# Patient Record
Sex: Female | Born: 1975 | Race: White | Hispanic: No | Marital: Married | Smoking: Never smoker
Health system: Southern US, Community
[De-identification: ages and names within clinical notes are randomized; demographics above are authoritative.]

## PROBLEM LIST (undated history)

## (undated) DIAGNOSIS — R51 Headache: Secondary | ICD-10-CM

## (undated) DIAGNOSIS — F329 Major depressive disorder, single episode, unspecified: Secondary | ICD-10-CM

## (undated) DIAGNOSIS — F32A Depression, unspecified: Secondary | ICD-10-CM

## (undated) HISTORY — PX: DILATION AND CURETTAGE OF UTERUS: SHX78

---

## 1997-08-28 HISTORY — PX: WISDOM TOOTH EXTRACTION: SHX21

## 2003-11-26 ENCOUNTER — Other Ambulatory Visit: Admission: RE | Admit: 2003-11-26 | Discharge: 2003-11-26 | Payer: Self-pay | Admitting: Family Medicine

## 2005-04-17 ENCOUNTER — Other Ambulatory Visit: Admission: RE | Admit: 2005-04-17 | Discharge: 2005-04-17 | Payer: Self-pay | Admitting: Family Medicine

## 2006-06-01 ENCOUNTER — Other Ambulatory Visit: Admission: RE | Admit: 2006-06-01 | Discharge: 2006-06-01 | Payer: Self-pay | Admitting: Family Medicine

## 2007-02-27 ENCOUNTER — Encounter: Admission: RE | Admit: 2007-02-27 | Discharge: 2007-04-03 | Payer: Self-pay | Admitting: Family Medicine

## 2007-06-13 ENCOUNTER — Other Ambulatory Visit: Admission: RE | Admit: 2007-06-13 | Discharge: 2007-06-13 | Payer: Self-pay | Admitting: Family Medicine

## 2010-09-12 ENCOUNTER — Inpatient Hospital Stay (HOSPITAL_COMMUNITY)
Admission: AD | Admit: 2010-09-12 | Discharge: 2010-09-16 | Payer: Self-pay | Source: Home / Self Care | Attending: Obstetrics and Gynecology | Admitting: Obstetrics and Gynecology

## 2010-09-13 ENCOUNTER — Encounter (INDEPENDENT_AMBULATORY_CARE_PROVIDER_SITE_OTHER): Payer: Self-pay | Admitting: Obstetrics and Gynecology

## 2010-09-14 LAB — CBC
HCT: 40 % (ref 36.0–46.0)
Hemoglobin: 14.2 g/dL (ref 12.0–15.0)
MCH: 30.9 pg (ref 26.0–34.0)
MCHC: 35.5 g/dL (ref 30.0–36.0)
MCV: 87 fL (ref 78.0–100.0)
Platelets: 249 10*3/uL (ref 150–400)
RBC: 4.6 MIL/uL (ref 3.87–5.11)
RDW: 14 % (ref 11.5–15.5)
WBC: 11.5 10*3/uL — ABNORMAL HIGH (ref 4.0–10.5)

## 2010-09-14 LAB — RPR: RPR Ser Ql: NONREACTIVE

## 2010-09-18 NOTE — Discharge Summary (Signed)
NAMEALLICE, GARRO            ACCOUNT NO.:  [id number]  MEDICAL RECORD NO.:  [id number]          PATIENT TYPE:  INP  LOCATION:  9118                          FACILITY:  [facility]  PHYSICIAN:  Huel Cote, M.D. DATE OF BIRTH:  03/31/1976  DATE OF ADMISSION:  09/12/2010 DATE OF DISCHARGE:  09/16/2010                              DISCHARGE SUMMARY   DISCHARGE DIAGNOSES: 1. Term pregnancy at 39-6/7 weeks' delivered. 2. Arrest of descent. 3. Chorioamnionitis. 4. Failed vacuum and a primary low transverse cesarean section.  DISCHARGE MEDICATIONS: 1. Motrin 600 mg p.o. every 6 h. 2. Percocet 1-2 tablets p.o. every 4 h. p.r.n.  DISCHARGE FOLLOWUP:  The patient is to follow up in the office in 2 weeks for an incision check.  HOSPITAL COURSE:  The patient is a 35 year old, G1, P0, who was admitted at 39-6/7 weeks after she presented with spontaneous rupture of membranes and no contractions.  She had had an uneventful prenatal course except for some mild depression which was stable on Zoloft.  PRENATAL LABORATORY DATA:  A positive, antibody negative, rubella immune, hepatitis B surface antigen negative, RPR negative, HIV negative, GC negative, Chlamydia negative, cystic fibrosis negative, 1- hour Glucola 102, group B strep negative, first trimester screen normal, alpha-fetoprotein negative.  PAST OBSTETRICAL HISTORY:  None.  PAST GYNECOLOGIC HISTORY:  None.  PAST SURGICAL HISTORY:  None.  PAST MEDICAL HISTORY:  Migraines and depression as stated.  ALLERGIES:  None.  MEDICATIONS:  Zoloft 50 mg p.o. daily.  SOCIAL HISTORY:  She is married and a Runner, broadcasting/film/video.  She has no tobacco, alcohol or drugs.  On admission, she was afebrile with stable vital signs.  Fetal heart rate was reactive.  Cervix was 51 at a -2 station with clear fluid noted on admission grossly ruptured.  She was having no significant contractions on admission, therefore, she was placed on low-dose Pitocin.  She  latent phase labor most of the night and early in the morning of the 17th, was found to be 6-cm dilated and then continued to progress to complete dilation.  She then pushed for approximately 3 hours and brought the vertex LOA to a +2 station.  At that point, options were discussed with the patient that she was making no further progress and decision was made to proceed with a attempted vacuum- assisted delivery.  This was done and no progress was made over 5 pulls. At that point, the patient's temperature was 100.4 and she was started on Unasyn for chorioamnionitis.  She then was counseled and underwent a primary low transverse C-section by Dr. Jackelyn Knife.  She had a viable female infant, Apgars were 9 and 9, weight was 8 pounds 13 ounces. Estimated blood loss was 1000 mL.  She was admitted for routine postoperative care.  On postop day #1, she was doing well.  Her hemoglobin dropped to 9.8 from 14.2.  She continued her Unasyn for 24 hours and this was discontinued when she was afebrile for that amount of time.  By postop day #3, she was ambulating well.  Her pain was well controlled on p.o. medication.  She was tolerating regular diet.  She  was afebrile with stable vital signs.  Her fundus was firm.  Her incision appeared well approximated.  Staples were removed and Steri- Strips were placed.  She was given instructions to follow up in the office in 2 weeks for an incision check.  Also, instructed on pelvic rest and we will call the office for that appointment.     Huel Cote, M.D.     KR/MEDQ  D:  09/16/2010  T:  09/16/2010  Job:  [id number]  Electronically Signed by Huel Cote M.D. on 09/18/2010 10:22:00 AM

## 2010-09-19 LAB — CBC
HCT: 28.3 % — ABNORMAL LOW (ref 36.0–46.0)
Hemoglobin: 9.8 g/dL — ABNORMAL LOW (ref 12.0–15.0)
MCH: 30.4 pg (ref 26.0–34.0)
MCHC: 34.6 g/dL (ref 30.0–36.0)
MCV: 87.9 fL (ref 78.0–100.0)
Platelets: 196 10*3/uL (ref 150–400)
RBC: 3.22 MIL/uL — ABNORMAL LOW (ref 3.87–5.11)
RDW: 13.9 % (ref 11.5–15.5)
WBC: 19.6 10*3/uL — ABNORMAL HIGH (ref 4.0–10.5)

## 2010-09-20 NOTE — Op Note (Signed)
Tonya Krause, Tonya Krause            ACCOUNT NO.:  [id number]  MEDICAL RECORD NO.:  [id number]          PATIENT TYPE:  INP  LOCATION:  9168                          [facility]ILITY:  [facility]  PHYSICIAN:  Leighton Roach Meisinger, M.D.DATE OF BIRTH:  01-08-1976  DATE OF PROCEDURE:  09/13/2010 DATE OF DISCHARGE:                              OPERATIVE REPORT   PREOPERATIVE DIAGNOSES:  Intrauterine pregnancy at 40 weeks, arrest of descent, and chorioamnionitis.  POSTOPERATIVE DIAGNOSES:  Intrauterine pregnancy at 40 weeks, arrest of descent, and chorioamnionitis.  PROCEDURE:  She had a primary low-transverse cesarean section.  SURGEON:  Zenaida Niece, M.D.  ASSISTANT:  Malachi Pro. Ambrose Mantle, M.D.  ANESTHESIA:  Epidural.  FINDINGS:  The patient had normal gravid anatomy and delivered a viable female infant with Apgars of 9 and 9, weight 8 pounds 13 ounces.  SPECIMENS:  Placenta sent for routine Pathology.  ESTIMATED BLOOD LOSS:  1000 mL.  COMPLICATIONS:  None.  PROCEDURE IN DETAIL:  The patient was taken to the operating room and placed in the dorsal supine position with left lateral tilt.  Abdomen was prepped and draped in the usual sterile fashion and a Foley catheter had previously been placed.  The level of her anesthesia was found to be adequate.  Abdomen was then entered via a standard Pfannenstiel incision without difficulty.  The Alexis disposable self-retaining retractor was then placed and good visualization was achieved.  A 4-cm transverse incision was then made in the lower uterine segment pushing the bladder inferior.  Once the uterine cavity was entered, the incision was extended digitally.  Fetal vertex was deep in the pelvis.  It was grasped and gradually brought to the incision.  The vertex then delivered atraumatically.  A loose nuchal cord x1 was reduced and mouth and nares were suctioned.  The remainder of the infant then delivered atraumatically.  The cord was doubly clamped  and cut and the infant handed to the awaiting pediatric team.  Cord blood was obtained and the placenta was manually removed.  Uterus was wiped dry with a clean lap pad and all clots and debris removed.  The incision had extended laterally on the right side into the uterine artery.  The incision was closed in two layers.  A suture from the left angle running locking #1 chromic to just to the right of the midline.  A similar suture starting at the right angle running locking with #1 chromic with adequate hemostasis.  An imbricating layer then with #1 chromic was also placed with adequate closure and adequate hemostasis.  Small amount of bleeding from the right side was controlled with figure-of-eight sutures of 3-0 Vicryl and electrocautery.  Tubes and ovaries were inspected and found to be normal.  The Alexis retractor was removed.  Subfascial space was irrigated and made hemostatic with electrocautery.  Fascia was closed in running fashion starting at both ends and meeting in the middle with 0 Vicryl.  Subcutaneous tissue was then irrigated and made hemostatic with electrocautery.  Skin was closed with staples followed by a sterile dressing.  Attention was turned vaginally.  The drapes were removed and the sterile dressing  was placed.  The patient had a vaginal tear from her vacuum delivery.  A sponge had been placed in the vagina and this was removed. The patient had a vaginal band of tissue that had torn when the vacuum was placed.  There was still some bleeding from the right side of vagina.  This was controlled with one figure-of-eight suture of 3-0 Vicryl.  No significant bleeding was noted.  The patient tolerated the procedure well and was taken to the recovery room in stable condition. Counts were correct and she had PAS hose on throughout the procedure.     Zenaida Niece, M.D.     TDM/MEDQ  D:  09/13/2010  T:  09/14/2010  Job:  [id number]  Electronically Signed by Lavina Hamman M.D. on 09/20/2010 09:01:58 AM

## 2013-04-03 ENCOUNTER — Other Ambulatory Visit: Payer: Self-pay | Admitting: Obstetrics and Gynecology

## 2013-04-03 NOTE — H&P (Signed)
[name], Tonya Krause            ACCOUNT NO.:  [id number]  MEDICAL RECORD NO.:  [id number]  LOCATION:  [facility]                         [facility]ACILITY:  [facility]  PHYSICIAN:  Malachi Pro. Ambrose Mantle, M.D. DATE OF BIRTH:  [birth year]  DATE OF ADMISSION:  04/04/2013 DATE OF DISCHARGE:                             HISTORY & PHYSICAL   PRESENT ILLNESS:  A 37 year old white female, para 1-0-0-1, gravida 2, who is admitted for suction D and C because of nonviable twin pregnancy. This patient had a routine 9 week ultrasound that showed twin pregnancies, one had an empty sac, the other had a nonviable 8 week and 3 day embryo.  This was repeated 3 days later and still showed the same findings.  PAST MEDICAL HISTORY:  Allergies to SULFA and IMITREX.  PAST SURGICAL HISTORY:  She has not had any surgeries except for C- section.  MEDICAL HISTORY:  Migraines.  FAMILY HISTORY:  Mother with diabetes.  Maternal grandfather with diabetes, heart disease, and mother also has hypothyroidism.  SOCIAL HISTORY:  The patient does not smoke, drink, or take drugs.  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  PHYSICAL EXAMINATION:  GENERAL:  On admission, well-developed, well- nourished white female, in no distress. HEART:  Normal size and sounds.  No murmurs. LUNGS:  Clear to auscultation. ABDOMEN:  Soft. PELVIC:  Has not been done, but the ultrasound has been done twice in the last week confirming an empty sac and a nonviable embryo.  ADMITTING IMPRESSION:  Intrauterine pregnancy at about 9 weeks with nonviable twin pregnancy.  The patient was advised to wait until Dr. Jackelyn Knife returns to have him decide on the course of action with her input, but she chose to proceed with evacuation of the uterus.  She has been counseled about the risks of surgery and is ready to proceed.  Her blood group and type is A positive.     Malachi Pro. Ambrose Mantle, M.D.    TFH/MEDQ  D:  04/03/2013  T:  04/03/2013  Job:  [id number]

## 2013-04-04 ENCOUNTER — Encounter (HOSPITAL_COMMUNITY): Payer: Self-pay | Admitting: Anesthesiology

## 2013-04-04 ENCOUNTER — Encounter (HOSPITAL_COMMUNITY)
Admission: RE | Disposition: A | Discharge status: Home / Self Care | Payer: Self-pay | Source: Ambulatory Visit | Attending: Obstetrics and Gynecology

## 2013-04-04 ENCOUNTER — Encounter (HOSPITAL_COMMUNITY): Payer: Self-pay | Admitting: *Deleted

## 2013-04-04 ENCOUNTER — Ambulatory Visit (HOSPITAL_COMMUNITY): Payer: BC Managed Care – PPO | Admitting: Anesthesiology

## 2013-04-04 ENCOUNTER — Ambulatory Visit (HOSPITAL_COMMUNITY)
Admission: RE | Admit: 2013-04-04 | Discharge: 2013-04-04 | Disposition: A | Discharge status: Home / Self Care | Payer: BC Managed Care – PPO | Source: Ambulatory Visit | Attending: Obstetrics and Gynecology | Admitting: Obstetrics and Gynecology

## 2013-04-04 DIAGNOSIS — O021 Missed abortion: Secondary | ICD-10-CM | POA: Insufficient documentation

## 2013-04-04 DIAGNOSIS — O0289 Other abnormal products of conception: Secondary | ICD-10-CM

## 2013-04-04 DIAGNOSIS — O30009 Twin pregnancy, unspecified number of placenta and unspecified number of amniotic sacs, unspecified trimester: Secondary | ICD-10-CM | POA: Insufficient documentation

## 2013-04-04 HISTORY — DX: Headache: R51

## 2013-04-04 HISTORY — DX: Depression, unspecified: F32.A

## 2013-04-04 HISTORY — DX: Major depressive disorder, single episode, unspecified: F32.9

## 2013-04-04 HISTORY — PX: DILATION AND EVACUATION: SHX1459

## 2013-04-04 LAB — URINALYSIS, ROUTINE W REFLEX MICROSCOPIC
Hgb urine dipstick: NEGATIVE
Protein, ur: NEGATIVE mg/dL
Specific Gravity, Urine: 1.02 (ref 1.005–1.030)
Urobilinogen, UA: 0.2 mg/dL (ref 0.0–1.0)

## 2013-04-04 LAB — COMPREHENSIVE METABOLIC PANEL
AST: 14 U/L (ref 0–37)
Albumin: 3.5 g/dL (ref 3.5–5.2)
BUN: 7 mg/dL (ref 6–23)
CO2: 21 mEq/L (ref 19–32)
Calcium: 9.3 mg/dL (ref 8.4–10.5)
Creatinine, Ser: 0.64 mg/dL (ref 0.50–1.10)
GFR calc non Af Amer: >90 mL/min (ref >90)

## 2013-04-04 LAB — CBC
HCT: 37.8 % (ref 36.0–46.0)
Hemoglobin: 13.2 g/dL (ref 12.0–15.0)
MCH: 28.3 pg (ref 26.0–34.0)
MCHC: 34.9 g/dL (ref 30.0–36.0)
MCV: 80.9 fL (ref 78.0–100.0)
Platelets: 256 10*3/uL (ref 150–400)
RBC: 4.67 MIL/uL (ref 3.87–5.11)
RDW: 13.5 % (ref 11.5–15.5)
WBC: 7.9 10*3/uL (ref 4.0–10.5)

## 2013-04-04 LAB — URINE MICROSCOPIC-ADD ON

## 2013-04-04 SURGERY — DILATION AND EVACUATION, UTERUS
Anesthesia: Monitor Anesthesia Care | Wound class: Clean Contaminated

## 2013-04-04 MED ORDER — FENTANYL CITRATE 0.05 MG/ML IJ SOLN
INTRAMUSCULAR | Status: AC
  Filled 2013-04-04: qty 5

## 2013-04-04 MED ORDER — LIDOCAINE HCL 1 % IJ SOLN
INTRAMUSCULAR | Status: DC | PRN
  Administered 2013-04-04: 10 mL

## 2013-04-04 MED ORDER — FENTANYL CITRATE 0.05 MG/ML IJ SOLN: 25.0000 ug | INTRAMUSCULAR | Status: DC | PRN

## 2013-04-04 MED ORDER — ONDANSETRON HCL 4 MG/2ML IJ SOLN
INTRAMUSCULAR | Status: AC
  Filled 2013-04-04: qty 2

## 2013-04-04 MED ORDER — MIDAZOLAM HCL 5 MG/5ML IJ SOLN
INTRAMUSCULAR | Status: DC | PRN
  Administered 2013-04-04: 2 mg via INTRAVENOUS

## 2013-04-04 MED ORDER — MIDAZOLAM HCL 2 MG/2ML IJ SOLN
INTRAMUSCULAR | Status: AC
  Filled 2013-04-04: qty 2

## 2013-04-04 MED ORDER — PROPOFOL 10 MG/ML IV EMUL
INTRAVENOUS | Status: AC
  Filled 2013-04-04: qty 20

## 2013-04-04 MED ORDER — LACTATED RINGERS IV SOLN
INTRAVENOUS | Status: DC
  Administered 2013-04-04 (×2): via INTRAVENOUS

## 2013-04-04 MED ORDER — FENTANYL CITRATE 0.05 MG/ML IJ SOLN
INTRAMUSCULAR | Status: DC | PRN
  Administered 2013-04-04 (×3): 50 ug via INTRAVENOUS

## 2013-04-04 MED ORDER — ONDANSETRON HCL 4 MG/2ML IJ SOLN
INTRAMUSCULAR | Status: DC | PRN
  Administered 2013-04-04: 4 mg via INTRAVENOUS

## 2013-04-04 MED ORDER — LIDOCAINE HCL (CARDIAC) 20 MG/ML IV SOLN
INTRAVENOUS | Status: DC | PRN
  Administered 2013-04-04: 50 mg via INTRAVENOUS

## 2013-04-04 MED ORDER — PROPOFOL INFUSION 10 MG/ML OPTIME
INTRAVENOUS | Status: DC | PRN
  Administered 2013-04-04: 160 ug/kg/min via INTRAVENOUS

## 2013-04-04 MED ORDER — IBUPROFEN 600 MG PO TABS: 600.0000 mg | ORAL_TABLET | Freq: Four times a day (QID) | ORAL | Status: DC | PRN

## 2013-04-04 MED ORDER — OXYTOCIN 10 UNIT/ML IJ SOLN: INTRAMUSCULAR | Status: DC | PRN

## 2013-04-04 MED ORDER — KETOROLAC TROMETHAMINE 30 MG/ML IJ SOLN
INTRAMUSCULAR | Status: DC | PRN
  Administered 2013-04-04: 30 mg via INTRAVENOUS

## 2013-04-04 MED ORDER — OXYTOCIN 10 UNIT/ML IJ SOLN
INTRAMUSCULAR | Status: AC
  Filled 2013-04-04: qty 2

## 2013-04-04 MED ORDER — OXYTOCIN 10 UNIT/ML IJ SOLN
INTRAMUSCULAR | Status: DC | PRN
  Administered 2013-04-04: 20 [IU] via INTRAMUSCULAR

## 2013-04-04 MED ORDER — KETOROLAC TROMETHAMINE 30 MG/ML IJ SOLN: 15.0000 mg | Freq: Once | INTRAMUSCULAR | Status: DC | PRN

## 2013-04-04 SURGICAL SUPPLY — 20 items
CATH ROBINSON RED A/P 16FR (CATHETERS) ×2 IMPLANT
CLOTH BEACON ORANGE TIMEOUT ST (SAFETY) ×2 IMPLANT
DECANTER SPIKE VIAL GLASS SM (MISCELLANEOUS) ×2 IMPLANT
GLOVE BIO SURGEON STRL SZ7.5 (GLOVE) ×2 IMPLANT
GOWN PREVENTION PLUS XLARGE (GOWN DISPOSABLE) ×2 IMPLANT
GOWN STRL REIN XL XLG (GOWN DISPOSABLE) ×4 IMPLANT
KIT BERKELEY 1ST TRIMESTER 3/8 (MISCELLANEOUS) ×2 IMPLANT
NDL SPNL 22GX3.5 QUINCKE BK (NEEDLE) ×1 IMPLANT
NEEDLE SPNL 22GX3.5 QUINCKE BK (NEEDLE) ×2 IMPLANT
NS IRRIG 1000ML POUR BTL (IV SOLUTION) ×2 IMPLANT
PACK VAGINAL MINOR WOMEN LF (CUSTOM PROCEDURE TRAY) ×2 IMPLANT
PAD OB MATERNITY 4.3X12.25 (PERSONAL CARE ITEMS) ×2 IMPLANT
PAD PREP 24X48 CUFFED NSTRL (MISCELLANEOUS) ×2 IMPLANT
SET BERKELEY SUCTION TUBING (SUCTIONS) ×2 IMPLANT
SYR CONTROL 10ML LL (SYRINGE) ×2 IMPLANT
TOWEL OR 17X24 6PK STRL BLUE (TOWEL DISPOSABLE) ×4 IMPLANT
VACURETTE 10 RIGID CVD (CANNULA) IMPLANT
VACURETTE 7MM CVD STRL WRAP (CANNULA) IMPLANT
VACURETTE 8 RIGID CVD (CANNULA) IMPLANT
VACURETTE 9 RIGID CVD (CANNULA) IMPLANT

## 2013-04-04 NOTE — Op Note (Signed)
Tonya Krause, GASCA            ACCOUNT NO.:  [id number]  MEDICAL RECORD NO.:  [id number]  LOCATION:  [facility]                          [facility]  [facility]  PHYSICIAN:  Malachi Pro. Ambrose Mantle, M.D. DATE OF BIRTH:  1976/08/01  DATE OF PROCEDURE:  04/04/2013 DATE OF DISCHARGE:                              OPERATIVE REPORT   PREOPERATIVE DIAGNOSIS:  Nonviable twin pregnancy at 8-1/2 weeks.  POSTOPERATIVE DIAGNOSIS:  Nonviable twin pregnancy at 8-1/2 weeks.  OPERATION:  Suction D and C.  OPERATOR:  Malachi Pro. Ambrose Mantle, M.D.  ANESTHESIA:  MAC anesthesia.  OPERATIVE REPORT:  The patient was brought to the operating room and given general anesthesia by MAC.  She was placed in lithotomy position. The vulva, vagina, perineum, and urethra were prepped with Betadine solution.  The bladder was emptied with [place] catheter.  The uterus was posterior approximately 8-9 week size.  The adnexa were free of masses. A weighted speculum was placed posteriorly.  The anterior lip of the cervix was grasped with a tenaculum and the uterus sounded to 11 cm posteriorly.  The uterus was then dilated to a #31 Pratt dilator.  A #9 curved suction curette easily entered the endometrial cavity.  A suction D and C was done, producing significant amount of tissue.  I then used a sharp curette to curette the walls to see if I thought all the tissue was removed and then did a final circuit with the suction.  I then inspected the tissue after the procedure was over.  I felt like we had 25 g of tissue.  The patient seemed to tolerate the procedure well.  I held pressure on the tenaculum sites for hemostasis.  There was no bleeding at the end of the procedure.  The patient was started on Pitocin during the procedure.  Her blood group and type was A positive. Blood loss about 100 mL.     Malachi Pro. Ambrose Mantle, M.D.     TFH/MEDQ  D:  04/04/2013  T:  04/04/2013  Job:  [id number]

## 2013-04-04 NOTE — Anesthesia Preprocedure Evaluation (Signed)
Anesthesia Evaluation  Patient identified by MRN, date of birth, ID band Patient awake    Reviewed: Allergy & Precautions, H&P , NPO status , Patient's Chart, lab work & pertinent test results, reviewed documented beta blocker date and time   History of Anesthesia Complications Negative for: history of anesthetic complications  Airway Mallampati: I TM Distance: >3 FB Neck ROM: full    Dental  (+) Teeth Intact   Pulmonary neg pulmonary ROS,  breath sounds clear to auscultation        Cardiovascular negative cardio ROS  Rhythm:regular Rate:Normal     Neuro/Psych  Headaches, PSYCHIATRIC DISORDERS    GI/Hepatic negative GI ROS, Neg liver ROS,   Endo/Other  negative endocrine ROS  Renal/GU negative Renal ROS  negative genitourinary   Musculoskeletal   Abdominal   Peds  Hematology negative hematology ROS (+)   Anesthesia Other Findings   Reproductive/Obstetrics (+) Pregnancy (missed ab 8 weeks, twins)                           Anesthesia Physical Anesthesia Plan  ASA: II  Anesthesia Plan: MAC   Post-op Pain Management:    Induction:   Airway Management Planned:   Additional Equipment:   Intra-op Plan:   Post-operative Plan:   Informed Consent: I have reviewed the patients History and Physical, chart, labs and discussed the procedure including the risks, benefits and alternatives for the proposed anesthesia with the patient or authorized representative who has indicated his/her understanding and acceptance.     Plan Discussed with: Surgeon and CRNA  Anesthesia Plan Comments:         Anesthesia Quick Evaluation

## 2013-04-04 NOTE — Transfer of Care (Signed)
Immediate Anesthesia Transfer of Care Note  Patient: Tonya Krause  Procedure(s) Performed: Procedure(s) with comments: DILATATION AND EVACUATION (N/A) - 1hr OR time  Patient Location: PACU  Anesthesia Type:MAC  Level of Consciousness: awake, alert , oriented and patient cooperative  Airway & Oxygen Therapy: Patient Spontanous Breathing  Post-op Assessment: Report given to PACU RN and Post -op Vital signs reviewed and stable  Post vital signs: Reviewed and stable  Complications: No apparent anesthesia complications

## 2013-04-04 NOTE — Progress Notes (Signed)
Patient ID: Tonya Krause, female   DOB: 1976-05-22, 37 y.o.   MRN: [id number] I examined this lady 04-03-13 and she reports no change in her health since that time.

## 2013-04-04 NOTE — Anesthesia Postprocedure Evaluation (Signed)
  Anesthesia Post-op Note  Anesthesia Post Note  Patient: Tonya Krause  Procedure(s) Performed: Procedure(s) (LRB): DILATATION AND EVACUATION (N/A)  Anesthesia type: MAC  Patient location: PACU  Post pain: Pain level controlled  Post assessment: Post-op Vital signs reviewed  Last Vitals:  Filed Vitals:   04/04/13 1445  BP:   Pulse: 73  Temp: 36.8 C  Resp: 18    Post vital signs: Reviewed  Level of consciousness: sedated  Complications: No apparent anesthesia complications

## 2013-04-05 LAB — URINE CULTURE

## 2013-04-07 ENCOUNTER — Encounter (HOSPITAL_COMMUNITY): Payer: Self-pay | Admitting: Obstetrics and Gynecology

## 2013-07-03 ENCOUNTER — Other Ambulatory Visit: Payer: Self-pay

## 2013-10-31 ENCOUNTER — Other Ambulatory Visit: Payer: Self-pay | Admitting: *Deleted

## 2013-10-31 DIAGNOSIS — R002 Palpitations: Secondary | ICD-10-CM

## 2013-10-31 NOTE — Progress Notes (Signed)
holter ordered by DR A. Morrow/ palpitations, placed under DOD for 11/05/13 Dr Delton SeeNelson

## 2013-11-05 ENCOUNTER — Encounter (INDEPENDENT_AMBULATORY_CARE_PROVIDER_SITE_OTHER): Payer: BC Managed Care – PPO

## 2013-11-05 ENCOUNTER — Encounter: Payer: Self-pay | Admitting: Radiology

## 2013-11-05 DIAGNOSIS — R002 Palpitations: Secondary | ICD-10-CM

## 2013-11-05 NOTE — Progress Notes (Signed)
Patient ID: Tonya Krause, female   DOB: 11/22/1975, 38 y.o.   MRN: [id number] Evo 24hr holter monitor applied

## 2013-12-08 ENCOUNTER — Ambulatory Visit (HOSPITAL_COMMUNITY): Payer: BC Managed Care – PPO | Attending: Family Medicine | Admitting: Cardiology

## 2013-12-08 ENCOUNTER — Other Ambulatory Visit (HOSPITAL_COMMUNITY): Payer: Self-pay | Admitting: Family Medicine

## 2013-12-08 DIAGNOSIS — R002 Palpitations: Secondary | ICD-10-CM | POA: Insufficient documentation

## 2013-12-08 NOTE — Progress Notes (Signed)
Echo performed. 

## 2014-04-29 LAB — OB RESULTS CONSOLE HEPATITIS B SURFACE ANTIGEN: Hepatitis B Surface Ag: NEGATIVE

## 2014-04-29 LAB — OB RESULTS CONSOLE GC/CHLAMYDIA
Chlamydia: NEGATIVE
Gonorrhea: NEGATIVE

## 2014-04-29 LAB — OB RESULTS CONSOLE RUBELLA ANTIBODY, IGM: Rubella: IMMUNE

## 2014-04-29 LAB — OB RESULTS CONSOLE ABO/RH: RH TYPE: POSITIVE

## 2014-04-29 LAB — OB RESULTS CONSOLE ANTIBODY SCREEN: ANTIBODY SCREEN: NEGATIVE

## 2014-04-29 LAB — OB RESULTS CONSOLE HIV ANTIBODY (ROUTINE TESTING): HIV: NONREACTIVE

## 2014-10-23 ENCOUNTER — Other Ambulatory Visit (HOSPITAL_COMMUNITY): Payer: Self-pay

## 2014-11-23 ENCOUNTER — Encounter (HOSPITAL_COMMUNITY)
Admission: RE | Admit: 2014-11-23 | Discharge: 2014-11-23 | Disposition: A | Discharge status: Home / Self Care | Payer: BLUE CROSS/BLUE SHIELD | Source: Ambulatory Visit | Attending: Obstetrics and Gynecology | Admitting: Obstetrics and Gynecology

## 2014-11-23 ENCOUNTER — Encounter (HOSPITAL_COMMUNITY): Payer: Self-pay

## 2014-11-23 DIAGNOSIS — Z01812 Encounter for preprocedural laboratory examination: Secondary | ICD-10-CM

## 2014-11-23 LAB — CBC
HCT: 36.7 % (ref 36.0–46.0)
HEMOGLOBIN: 12.8 g/dL (ref 12.0–15.0)
MCH: 30.3 pg (ref 26.0–34.0)
MCHC: 34.9 g/dL (ref 30.0–36.0)
MCV: 86.8 fL (ref 78.0–100.0)
PLATELETS: 201 10*3/uL (ref 150–400)
RBC: 4.23 MIL/uL (ref 3.87–5.11)
RDW: 14.4 % (ref 11.5–15.5)
WBC: 8.4 10*3/uL (ref 4.0–10.5)

## 2014-11-23 LAB — TYPE AND SCREEN
ABO/RH(D): A POS
Antibody Screen: NEGATIVE

## 2014-11-23 LAB — RPR: RPR: NONREACTIVE

## 2014-11-23 LAB — ABO/RH: ABO/RH(D): A POS

## 2014-11-23 MED ORDER — CEFAZOLIN SODIUM-DEXTROSE 2-3 GM-% IV SOLR
2.0000 g | INTRAVENOUS | Status: AC
  Administered 2014-11-24: 2 g via INTRAVENOUS

## 2014-11-23 NOTE — H&P (Signed)
Tonya Krause is a 39 y.o. female, G3 P1011, EGA [redacted] weeks with EDC 4-5 presenting for repeat c-section.  Previous LTCS, declines VBAC.  Prenatal care complicated by irregular FHR with normal fetal ECHO with PACs, no other problems.  See prenatal records for complete history.  Maternal Medical History:  Fetal activity: Perceived fetal activity is normal.    Prenatal complications: Irregular FHR    OB History    Gravida Para Term Preterm AB TAB SAB Ectopic Multiple Living   1             LTCS at tern for arrest of descent, SAB  Past Medical History  Diagnosis Date  . Depression   . Headache(784.0) migraines    takes topamax when not pregant   Past Surgical History  Procedure Laterality Date  . Cesarean section  09/13/2010  . Wisdom tooth extraction  1999  . Dilation and evacuation N/A 04/04/2013    Procedure: DILATATION AND EVACUATION;  Surgeon: Bing Plumehomas F Henley, MD;  Location: [facility];  Service: Gynecology;  Laterality: N/A;  1hr OR time  . Dilation and curettage of uterus     Family History: family history is not on file. Social History:  reports that she has never smoked. She has never used smokeless tobacco. She reports that she drinks alcohol. She reports that she does not use illicit drugs.   Prenatal Transfer Tool  Maternal Diabetes: No Genetic Screening: Normal Maternal Ultrasounds/Referrals: Normal Fetal Ultrasounds or other Referrals:  Fetal echo Maternal Substance Abuse:  No Significant Maternal Medications:  None Significant Maternal Lab Results:  Lab values include: Group B Strep negative Other Comments:  Irreg FHR-PACs, normal fetal ECHO  Review of Systems  Respiratory: Negative.   Cardiovascular: Negative.       Last menstrual period 02/20/2014. Maternal Exam:  Abdomen: Patient reports no abdominal tenderness. Surgical scars: low transverse.   Estimated fetal weight is 8 lbs.   Fetal presentation: vertex  Introitus: Normal vulva. Normal vagina.   Pelvis: adequate for delivery.   Cervix: Cervix evaluated by digital exam.     Physical Exam  Constitutional: She appears well-developed and well-nourished.  Neck: Neck supple. No thyromegaly present.  Cardiovascular: Normal rate, regular rhythm and normal heart sounds.   No murmur heard. Respiratory: Effort normal and breath sounds normal. No respiratory distress. She has no wheezes.  GI: Soft.    Prenatal labs: ABO, Rh: --/--/A POS, A POS (03/28 0830) Antibody: NEG (03/28 0830) Rubella: non-immune RPR: Non Reactive (03/28 0830)  HBsAg: Negative (09/02 0000)  HIV: Non-reactive (09/02 0000)  GBS:   neg  Assessment/Plan: IUP at 39 weeks, previous LTCS, declines VBAC, admitted for repeat c-section.  Irreg FHR during pregnancy, normal fetal ECHO with PACs.  C-section procedure and risks discussed.  Will admit for repeat c-section.     Tonya Krause D 11/23/2014, 9:21 PM

## 2014-11-23 NOTE — Patient Instructions (Addendum)
   Your procedure is scheduled on: MARCH 29 AT 730AM  [facility]: [facility]  Pick up the phone at the desk and dial [phone] and inform us of your arrival.  Please call this number if you have any problems the morning of surgery: [phone]  Remember: Do not eat food or drink liquids after midnight: MARCH 28 (MONDAY NIGHT)  Take these medicines the morning of surgery with a SIP OF WATER: TAKE ZOLOFT NIGHT BEFORE SURGERY  Do not wear jewelry, make-up, or FINGER nail polish No metal in your hair or on your body. Do not wear lotions, powders, perfumes.  You may wear deodorant.  Do not bring valuables to the hospital. Contacts, dentures or bridgework may not be worn into surgery.  Leave suitcase in the car. After Surgery it may be brought to your room. For patients being admitted to the hospital, checkout time is 11:00am the day of discharge.

## 2014-11-24 ENCOUNTER — Inpatient Hospital Stay (HOSPITAL_COMMUNITY)
Admission: RE | Admit: 2014-11-24 | Discharge: 2014-11-26 | DRG: 766 | Disposition: A | Discharge status: Home / Self Care | Payer: BLUE CROSS/BLUE SHIELD | Source: Ambulatory Visit | Attending: Obstetrics and Gynecology | Admitting: Obstetrics and Gynecology

## 2014-11-24 ENCOUNTER — Inpatient Hospital Stay (HOSPITAL_COMMUNITY): Payer: BLUE CROSS/BLUE SHIELD | Admitting: Certified Registered Nurse Anesthetist

## 2014-11-24 ENCOUNTER — Encounter (HOSPITAL_COMMUNITY)
Admission: RE | Disposition: A | Discharge status: Home / Self Care | Payer: Self-pay | Source: Ambulatory Visit | Attending: Obstetrics and Gynecology

## 2014-11-24 ENCOUNTER — Encounter (HOSPITAL_COMMUNITY): Payer: Self-pay | Admitting: *Deleted

## 2014-11-24 DIAGNOSIS — O09523 Supervision of elderly multigravida, third trimester: Secondary | ICD-10-CM | POA: Diagnosis present

## 2014-11-24 DIAGNOSIS — O3421 Maternal care for scar from previous cesarean delivery: Secondary | ICD-10-CM | POA: Diagnosis present

## 2014-11-24 DIAGNOSIS — Z98891 History of uterine scar from previous surgery: Secondary | ICD-10-CM

## 2014-11-24 DIAGNOSIS — Z3A39 39 weeks gestation of pregnancy: Secondary | ICD-10-CM | POA: Diagnosis present

## 2014-11-24 SURGERY — Surgical Case
Anesthesia: Spinal

## 2014-11-24 MED ORDER — LACTATED RINGERS IV SOLN
INTRAVENOUS | Status: DC | PRN
  Administered 2014-11-24 (×4): via INTRAVENOUS

## 2014-11-24 MED ORDER — METHYLERGONOVINE MALEATE 0.2 MG/ML IJ SOLN
INTRAMUSCULAR | Status: DC | PRN
  Administered 2014-11-24: 0.2 mg via INTRAMUSCULAR

## 2014-11-24 MED ORDER — SERTRALINE HCL 50 MG PO TABS
50.0000 mg | ORAL_TABLET | Freq: Every day | ORAL | Status: DC
  Administered 2014-11-24 – 2014-11-25 (×2): 50 mg via ORAL
  Filled 2014-11-24 (×4): qty 1

## 2014-11-24 MED ORDER — ONDANSETRON HCL 4 MG/2ML IJ SOLN
4.0000 mg | Freq: Three times a day (TID) | INTRAMUSCULAR | Status: DC | PRN
Start: 2014-11-24 — End: 2014-11-26

## 2014-11-24 MED ORDER — CARBOPROST TROMETHAMINE 250 MCG/ML IM SOLN
INTRAMUSCULAR | Status: AC
  Filled 2014-11-24: qty 1

## 2014-11-24 MED ORDER — KETOROLAC TROMETHAMINE 30 MG/ML IJ SOLN
INTRAMUSCULAR | Status: AC
  Filled 2014-11-24: qty 1

## 2014-11-24 MED ORDER — SCOPOLAMINE 1 MG/3DAYS TD PT72
MEDICATED_PATCH | TRANSDERMAL | Status: AC
  Administered 2014-11-24: 1.5 mg via TRANSDERMAL
  Filled 2014-11-24: qty 1

## 2014-11-24 MED ORDER — NALBUPHINE HCL 10 MG/ML IJ SOLN: 5.0000 mg | INTRAMUSCULAR | Status: DC | PRN

## 2014-11-24 MED ORDER — ZOLPIDEM TARTRATE 5 MG PO TABS: 5.0000 mg | ORAL_TABLET | Freq: Every evening | ORAL | Status: DC | PRN

## 2014-11-24 MED ORDER — MORPHINE SULFATE 0.5 MG/ML IJ SOLN
INTRAMUSCULAR | Status: AC
  Filled 2014-11-24: qty 10

## 2014-11-24 MED ORDER — PHENYLEPHRINE 8 MG IN D5W 100 ML (0.08MG/ML) PREMIX OPTIME
INJECTION | INTRAVENOUS | Status: AC
  Filled 2014-11-24: qty 100

## 2014-11-24 MED ORDER — OXYCODONE-ACETAMINOPHEN 5-325 MG PO TABS
1.0000 | ORAL_TABLET | ORAL | Status: DC | PRN
  Administered 2014-11-25 – 2014-11-26 (×3): 1 via ORAL
  Filled 2014-11-24 (×3): qty 1

## 2014-11-24 MED ORDER — NALOXONE HCL 0.4 MG/ML IJ SOLN: 0.4000 mg | INTRAMUSCULAR | Status: DC | PRN

## 2014-11-24 MED ORDER — LACTATED RINGERS IV SOLN
INTRAVENOUS | Status: DC
  Administered 2014-11-24 (×2): via INTRAVENOUS

## 2014-11-24 MED ORDER — SCOPOLAMINE 1 MG/3DAYS TD PT72
1.0000 | MEDICATED_PATCH | Freq: Once | TRANSDERMAL | Status: DC
  Administered 2014-11-24: 1.5 mg via TRANSDERMAL

## 2014-11-24 MED ORDER — MEPERIDINE HCL 25 MG/ML IJ SOLN: 6.2500 mg | INTRAMUSCULAR | Status: DC | PRN

## 2014-11-24 MED ORDER — MAGNESIUM HYDROXIDE 400 MG/5ML PO SUSP: 30.0000 mL | ORAL | Status: DC | PRN

## 2014-11-24 MED ORDER — METHYLERGONOVINE MALEATE 0.2 MG/ML IJ SOLN
INTRAMUSCULAR | Status: AC
  Filled 2014-11-24: qty 1

## 2014-11-24 MED ORDER — NALOXONE HCL 1 MG/ML IJ SOLN
1.0000 ug/kg/h | INTRAVENOUS | Status: DC | PRN
  Filled 2014-11-24: qty 2

## 2014-11-24 MED ORDER — KETOROLAC TROMETHAMINE 30 MG/ML IJ SOLN: 30.0000 mg | Freq: Once | INTRAMUSCULAR | Status: DC | PRN

## 2014-11-24 MED ORDER — KETOROLAC TROMETHAMINE 30 MG/ML IJ SOLN
30.0000 mg | Freq: Four times a day (QID) | INTRAMUSCULAR | Status: AC | PRN
Start: 2014-11-24 — End: 2014-11-25
  Administered 2014-11-24: 30 mg via INTRAMUSCULAR

## 2014-11-24 MED ORDER — OXYTOCIN 10 UNIT/ML IJ SOLN
INTRAMUSCULAR | Status: AC
  Filled 2014-11-24: qty 4

## 2014-11-24 MED ORDER — SODIUM CHLORIDE 0.9 % IJ SOLN: 3.0000 mL | INTRAMUSCULAR | Status: DC | PRN

## 2014-11-24 MED ORDER — MORPHINE SULFATE (PF) 0.5 MG/ML IJ SOLN
INTRAMUSCULAR | Status: DC | PRN
  Administered 2014-11-24: .2 mg via INTRATHECAL

## 2014-11-24 MED ORDER — DIPHENHYDRAMINE HCL 50 MG/ML IJ SOLN: 12.5000 mg | INTRAMUSCULAR | Status: DC | PRN

## 2014-11-24 MED ORDER — PRENATAL MULTIVITAMIN CH
1.0000 | ORAL_TABLET | Freq: Every day | ORAL | Status: DC
  Administered 2014-11-24 – 2014-11-25 (×2): 1 via ORAL
  Filled 2014-11-24 (×2): qty 1

## 2014-11-24 MED ORDER — ONDANSETRON HCL 4 MG/2ML IJ SOLN
INTRAMUSCULAR | Status: AC
  Filled 2014-11-24: qty 4

## 2014-11-24 MED ORDER — LANOLIN HYDROUS EX OINT: 1.0000 "application " | TOPICAL_OINTMENT | CUTANEOUS | Status: DC | PRN

## 2014-11-24 MED ORDER — CEFAZOLIN SODIUM-DEXTROSE 2-3 GM-% IV SOLR
INTRAVENOUS | Status: AC
  Filled 2014-11-24: qty 50

## 2014-11-24 MED ORDER — OXYTOCIN 40 UNITS IN LACTATED RINGERS INFUSION - SIMPLE MED: 62.5000 mL/h | INTRAVENOUS | Status: AC

## 2014-11-24 MED ORDER — SENNOSIDES-DOCUSATE SODIUM 8.6-50 MG PO TABS
2.0000 | ORAL_TABLET | ORAL | Status: DC
  Administered 2014-11-24 – 2014-11-25 (×2): 2 via ORAL
  Filled 2014-11-24 (×2): qty 2

## 2014-11-24 MED ORDER — WITCH HAZEL-GLYCERIN EX PADS: 1.0000 "application " | MEDICATED_PAD | CUTANEOUS | Status: DC | PRN

## 2014-11-24 MED ORDER — LACTATED RINGERS IV SOLN
Freq: Once | INTRAVENOUS | Status: AC
  Administered 2014-11-24: 07:00:00 via INTRAVENOUS

## 2014-11-24 MED ORDER — PROMETHAZINE HCL 25 MG/ML IJ SOLN: 6.2500 mg | INTRAMUSCULAR | Status: DC | PRN

## 2014-11-24 MED ORDER — TETANUS-DIPHTH-ACELL PERTUSSIS 5-2.5-18.5 LF-MCG/0.5 IM SUSP: 0.5000 mL | Freq: Once | INTRAMUSCULAR | Status: DC

## 2014-11-24 MED ORDER — ACETAMINOPHEN 325 MG PO TABS: 650.0000 mg | ORAL_TABLET | ORAL | Status: DC | PRN

## 2014-11-24 MED ORDER — FENTANYL CITRATE 0.05 MG/ML IJ SOLN
INTRAMUSCULAR | Status: AC
  Filled 2014-11-24: qty 2

## 2014-11-24 MED ORDER — NALBUPHINE HCL 10 MG/ML IJ SOLN: 5.0000 mg | Freq: Once | INTRAMUSCULAR | Status: AC | PRN

## 2014-11-24 MED ORDER — MENTHOL 3 MG MT LOZG: 1.0000 | LOZENGE | OROMUCOSAL | Status: DC | PRN

## 2014-11-24 MED ORDER — IBUPROFEN 600 MG PO TABS
600.0000 mg | ORAL_TABLET | Freq: Four times a day (QID) | ORAL | Status: DC
  Administered 2014-11-24 – 2014-11-26 (×8): 600 mg via ORAL
  Filled 2014-11-24 (×8): qty 1

## 2014-11-24 MED ORDER — PHENYLEPHRINE 8 MG IN D5W 100 ML (0.08MG/ML) PREMIX OPTIME
INJECTION | INTRAVENOUS | Status: DC | PRN
  Administered 2014-11-24: 60 ug/min via INTRAVENOUS

## 2014-11-24 MED ORDER — PHENYLEPHRINE HCL 10 MG/ML IJ SOLN: INTRAMUSCULAR | Status: DC | PRN

## 2014-11-24 MED ORDER — SCOPOLAMINE 1 MG/3DAYS TD PT72: 1.0000 | MEDICATED_PATCH | Freq: Once | TRANSDERMAL | Status: DC

## 2014-11-24 MED ORDER — SIMETHICONE 80 MG PO CHEW
80.0000 mg | CHEWABLE_TABLET | ORAL | Status: DC | PRN
  Administered 2014-11-24 – 2014-11-25 (×3): 80 mg via ORAL
  Filled 2014-11-24 (×3): qty 1

## 2014-11-24 MED ORDER — FENTANYL CITRATE 0.05 MG/ML IJ SOLN
INTRAMUSCULAR | Status: DC | PRN
  Administered 2014-11-24: 12.5 ug via INTRATHECAL

## 2014-11-24 MED ORDER — KETOROLAC TROMETHAMINE 30 MG/ML IJ SOLN: 30.0000 mg | Freq: Four times a day (QID) | INTRAMUSCULAR | Status: AC | PRN

## 2014-11-24 MED ORDER — SODIUM CHLORIDE 0.9 % IR SOLN
Status: DC | PRN
  Administered 2014-11-24: 1000 mL

## 2014-11-24 MED ORDER — DIBUCAINE 1 % RE OINT: 1.0000 "application " | TOPICAL_OINTMENT | RECTAL | Status: DC | PRN

## 2014-11-24 MED ORDER — OXYTOCIN 40 UNITS IN LACTATED RINGERS INFUSION - SIMPLE MED
INTRAVENOUS | Status: DC | PRN
  Administered 2014-11-24: 40 [IU] via INTRAVENOUS

## 2014-11-24 MED ORDER — LACTATED RINGERS IV SOLN
INTRAVENOUS | Status: DC | PRN
  Administered 2014-11-24: 08:00:00 via INTRAVENOUS

## 2014-11-24 MED ORDER — DIPHENHYDRAMINE HCL 25 MG PO CAPS: 25.0000 mg | ORAL_CAPSULE | Freq: Four times a day (QID) | ORAL | Status: DC | PRN

## 2014-11-24 MED ORDER — MEASLES, MUMPS & RUBELLA VAC ~~LOC~~ INJ
0.5000 mL | INJECTION | Freq: Once | SUBCUTANEOUS | Status: AC
  Administered 2014-11-26: 0.5 mL via SUBCUTANEOUS
  Filled 2014-11-24 (×2): qty 0.5

## 2014-11-24 MED ORDER — DIPHENHYDRAMINE HCL 25 MG PO CAPS
25.0000 mg | ORAL_CAPSULE | ORAL | Status: DC | PRN
Start: 2014-11-24 — End: 2014-11-26
  Filled 2014-11-24: qty 1

## 2014-11-24 MED ORDER — IBUPROFEN 600 MG PO TABS: 600.0000 mg | ORAL_TABLET | Freq: Four times a day (QID) | ORAL | Status: DC | PRN

## 2014-11-24 MED ORDER — ONDANSETRON HCL 4 MG/2ML IJ SOLN
INTRAMUSCULAR | Status: DC | PRN
  Administered 2014-11-24: 4 mg via INTRAVENOUS

## 2014-11-24 MED ORDER — BUPIVACAINE IN DEXTROSE 0.75-8.25 % IT SOLN
INTRATHECAL | Status: DC | PRN
  Administered 2014-11-24: 1.5 mL via INTRATHECAL

## 2014-11-24 SURGICAL SUPPLY — 38 items
APL SKNCLS STERI-STRIP NONHPOA (GAUZE/BANDAGES/DRESSINGS) ×1
BENZOIN TINCTURE PRP APPL 2/3 (GAUZE/BANDAGES/DRESSINGS) ×2 IMPLANT
CLAMP CORD UMBIL (MISCELLANEOUS) IMPLANT
CLOSURE WOUND 1/2 X4 (GAUZE/BANDAGES/DRESSINGS) ×1
CLOTH BEACON ORANGE TIMEOUT ST (SAFETY) ×3 IMPLANT
CONTAINER PREFILL 10% NBF 15ML (MISCELLANEOUS) IMPLANT
DRAPE SHEET LG 3/4 BI-LAMINATE (DRAPES) IMPLANT
DRSG OPSITE POSTOP 4X10 (GAUZE/BANDAGES/DRESSINGS) ×3 IMPLANT
DURAPREP 26ML APPLICATOR (WOUND CARE) ×3 IMPLANT
ELECT REM PT RETURN 9FT ADLT (ELECTROSURGICAL) ×3
ELECTRODE REM PT RTRN 9FT ADLT (ELECTROSURGICAL) ×1 IMPLANT
EXTRACTOR VACUUM KIWI (MISCELLANEOUS) IMPLANT
EXTRACTOR VACUUM M CUP 4 TUBE (SUCTIONS) IMPLANT
EXTRACTOR VACUUM M CUP 4' TUBE (SUCTIONS)
GLOVE ORTHO TXT STRL SZ7.5 (GLOVE) ×3 IMPLANT
GOWN STRL REUS W/TWL LRG LVL3 (GOWN DISPOSABLE) ×6 IMPLANT
KIT ABG SYR 3ML LUER SLIP (SYRINGE) IMPLANT
NDL HYPO 25X5/8 SAFETYGLIDE (NEEDLE) ×1 IMPLANT
NEEDLE HYPO 25X5/8 SAFETYGLIDE (NEEDLE) ×3 IMPLANT
NS IRRIG 1000ML POUR BTL (IV SOLUTION) ×3 IMPLANT
PACK C SECTION WH (CUSTOM PROCEDURE TRAY) ×3 IMPLANT
PAD OB MATERNITY 4.3X12.25 (PERSONAL CARE ITEMS) ×3 IMPLANT
RTRCTR C-SECT PINK 25CM LRG (MISCELLANEOUS) ×3 IMPLANT
STAPLER VISISTAT 35W (STAPLE) IMPLANT
STRIP CLOSURE SKIN 1/2X4 (GAUZE/BANDAGES/DRESSINGS) ×1 IMPLANT
SUT CHROMIC 1 CTX 36 (SUTURE) ×6 IMPLANT
SUT PLAIN 0 NONE (SUTURE) IMPLANT
SUT PLAIN 2 0 (SUTURE) ×3
SUT PLAIN 2 0 XLH (SUTURE) IMPLANT
SUT PLAIN ABS 2-0 CT1 27XMFL (SUTURE) IMPLANT
SUT VIC AB 0 CT1 27 (SUTURE) ×6
SUT VIC AB 0 CT1 27XBRD ANBCTR (SUTURE) ×2 IMPLANT
SUT VIC AB 2-0 CT1 (SUTURE) ×3 IMPLANT
SUT VIC AB 2-0 CT1 27 (SUTURE) ×3
SUT VIC AB 2-0 CT1 TAPERPNT 27 (SUTURE) ×1 IMPLANT
SUT VIC AB 4-0 KS 27 (SUTURE) ×2 IMPLANT
TOWEL OR 17X24 6PK STRL BLUE (TOWEL DISPOSABLE) ×3 IMPLANT
TRAY FOLEY CATH 14FR (SET/KITS/TRAYS/PACK) ×3 IMPLANT

## 2014-11-24 NOTE — Anesthesia Procedure Notes (Signed)
Spinal Patient location during procedure: OR Start time: 11/24/2014 7:22 AM End time: 11/24/2014 7:24 AM Staffing Anesthesiologist: Leilani AbleHATCHETT, Rayleen Wyrick Performed by: anesthesiologist  Preanesthetic Checklist Completed: patient identified, site marked, surgical consent, pre-op evaluation, timeout performed, IV checked, risks and benefits discussed and monitors and equipment checked Spinal Block Patient position: sitting Prep: site prepped and draped and DuraPrep Patient monitoring: heart rate, cardiac monitor, continuous pulse ox and blood pressure Approach: midline Location: L3-4 Injection technique: single-shot Needle Needle type: Pencan  Needle gauge: 24 G Needle length: 9 cm Needle insertion depth: 5 cm Assessment Sensory level: T4

## 2014-11-24 NOTE — Op Note (Signed)
Preoperative diagnosis: Intrauterine pregnancy at 39 weeks, previous c-section Postoperative diagnosis: Same Procedure: Repeat low transverse cesarean section without extensions Surgeon: Lavina Hammanodd Dicie Edelen M.D. Assistant:  Tracey Harrieshomas Henley, MD Anesthesia: Spinal  Findings: Patient had normal gravid anatomy and delivered a viable female infant with Apgars and weight pending Estimated blood loss: 800 cc Specimens: Placenta sent to labor and delivery Complications: None  Procedure in detail: The patient was taken to the operating room and placed in the sitting position. Dr. Arby BarretteHatchett instilled spinal anesthesia.  She was then placed in the dorsosupine position with left tilt. Abdomen was then prepped and draped in the usual sterile fashion, and a foley catheter was inserted. The level of her anesthesia was found to be adequate. Abdomen was entered via a standard Pfannenstiel incision through her previous scar. Once the peritoneal cavity was entered the Alexis disposable self-retaining retractor was placed and good visualization was achieved. Some bladder flap adhesions were taken down sharply.  A 4 cm transverse incision was then made in the lower uterine segment pushing the bladder inferior. Once the uterine cavity was entered the incision was extended digitally. The fetal vertex was grasped and delivered through the incision atraumatically. Mouth and nares were suctioned. The remainder of the infant then delivered atraumatically. Cord was doubly clamped and cut and the infant handed to the awaiting pediatric team. Cord blood was obtained. The placenta delivered spontaneously. Uterus was wiped dry with clean lap pad and all clots and debris were removed. Uterine incision was inspected and found to be free of extensions. Uterine incision was closed in 1 layer with running #1 Chromic. Tubes and ovaries were inspected and found to be normal. Uterine incision was inspected and found to be hemostatic. Bleeding from  serosal edges was controlled with electrocautery.  The uterus was a little boggy but responded to IV pitocin and IM Methergine. The Alexis retractor was removed. Subfascial space was irrigated and made hemostatic with electrocautery. Peritoneum was closed with 2-0 Vicryl.  Fascia was closed in running fashion starting at both ends and meeting in the middle with 0 Vicryl. Subcutaneous tissue was then irrigated and made hemostatic with electrocautery, then closed with running 2-0 plain gut. Skin was closed with running 4-0 Vicryl subcuticular suture followed by steri-strips and a sterile dressing. Patient tolerated the procedure well and was taken to the recovery in stable condition. Counts were correct x2, she received Ancef 2 g IV at the beginning of the procedure and she had PAS hose on throughout the procedure.

## 2014-11-24 NOTE — Anesthesia Preprocedure Evaluation (Addendum)
Anesthesia Evaluation  Patient identified by MRN, date of birth, ID band Patient awake    Reviewed: Allergy & Precautions, H&P , NPO status , Patient's Chart, lab work & pertinent test results  Airway Mallampati: II  TM Distance: >3 FB Neck ROM: full    Dental no notable dental hx.    Pulmonary neg pulmonary ROS,    Pulmonary exam normal       Cardiovascular Exercise Tolerance: Good negative cardio ROS      Neuro/Psych    GI/Hepatic negative GI ROS, Neg liver ROS,   Endo/Other  negative endocrine ROS  Renal/GU negative Renal ROS     Musculoskeletal   Abdominal Normal abdominal exam  (+)   Peds  Hematology negative hematology ROS (+)   Anesthesia Other Findings   Reproductive/Obstetrics (+) Pregnancy                            Anesthesia Physical Anesthesia Plan  ASA: II  Anesthesia Plan: Spinal   Post-op Pain Management:    Induction:   Airway Management Planned:   Additional Equipment:   Intra-op Plan:   Post-operative Plan:   Informed Consent: I have reviewed the patients History and Physical, chart, labs and discussed the procedure including the risks, benefits and alternatives for the proposed anesthesia with the patient or authorized representative who has indicated his/her understanding and acceptance.   Dental Advisory Given  Plan Discussed with: CRNA and Surgeon  Anesthesia Plan Comments: (Lab work confirmed with CRNA in room. Platelets okay. Discussed spinal anesthetic, and patient consents to the procedure:  included risk of possible headache,backache, failed block, allergic reaction, and nerve injury. This patient was asked if she had any questions or concerns before the procedure started. )       Anesthesia Quick Evaluation

## 2014-11-24 NOTE — Interval H&P Note (Signed)
History and Physical Interval Note:  11/24/2014 7:03 AM  [name]  has presented today for surgery, with the diagnosis of Repeat C/SectioN  The various methods of treatment have been discussed with the patient and family. After consideration of risks, benefits and other options for treatment, the patient has consented to  Procedure(s) with comments: REPEAT  CESAREAN SECTION (N/A) - MD requests RNFA as a surgical intervention .  The patient's history has been reviewed, patient examined, no change in status, stable for surgery.  I have reviewed the patient's chart and labs.  Questions were answered to the patient's satisfaction.     Eugenio Dollins D

## 2014-11-24 NOTE — Addendum Note (Signed)
Addendum  created 11/24/14 1502 by Shanon PayorSuzanne M Ulysses Alper, CRNA   Modules edited: Notes Section   Notes Section:  File: [id number]

## 2014-11-24 NOTE — Lactation Note (Signed)
This note was copied from the chart of Tonya Krause. Lactation Consultation Note  Patient Name: Tonya Krause Reason for consult: Follow-up assessment of this mom and baby at 14 hours pp.  RN, Waynetta SandyBeth had assisted with previous and this current feeding, using a #20 NS but at this feeding, baby had been fussy and not easily latching.  LC arrived and observed baby well-latched to (R) breast with NS in place.  Rhythmical sucking bursts and swallows noted for >10 minutes but baby suddenly slipped off and became briefly fussy, with NS in mouth but not deep.  LC ready to assist but while elevating mom's bed level, baby latched again on his own and resumed rhythmical sucking bursts and swallows again.  LC encouraged continued cue feedings, calming techniques and suck training as needed.   Maternal Data    Feeding Feeding Type: Breast Fed Length of feed: 0 min  LATCH Score/Interventions Latch: Grasps breast easily, tongue down, lips flanged, rhythmical sucking. Intervention(s): Breast compression  Audible Swallowing: Spontaneous and intermittent Intervention(s): Skin to skin;Alternate breast massage  Type of Nipple: Flat (everts into nipple shield)  Comfort (Breast/Nipple): Soft / non-tender     Hold (Positioning): No assistance needed to correctly position infant at breast. (baby became fussy but re-latched without help)  LATCH Score: 9 (LC observed baby re-latching on his own)  Lactation Tools Discussed/Used Nipple shield size: 20 Cue feedings Calming techniques Suck training  Consult Status Consult Status: Follow-up Date: 11/25/14 Follow-up type: In-patient    Tonya Krause, Tonya Krause Krause, 10:37 PM

## 2014-11-24 NOTE — Lactation Note (Signed)
This note was copied from the chart of Tonya Krause. Lactation Consultation Note  Patient Name: Tonya Reuel DerbyVictoria Ziemann Today's Date: 11/24/2014 Reason for consult: Initial assessment  Baby is 6 hours old, mom and dad requesting assistance with latch. Mom trying to latch in the cross cradle position. Baby mouth nipple . LC recommended switching to the football position for better support due to the weight and length of the baby  Baby tolerated the change in position well. Attempted the 1st breast , on and off pattern, and per mom noted intermittent pinching. LC suggested we switch to the left breast, areola less edematous and more compressible. LC instructed mom on the use hand pump after breast massage , hand express, ( steady flow of colostrum noted ), and prepump to make the nipple  And areola more compressible for a deeper latch. Baby latch improved after those 3 steps to latch. Intermittent swallows noted, and increased with  Breast compressions. LC's assessment of the baby's oral cavity upper lip flanges well, short anterior frenulum , and a recessed chin .  Baby opens mouth wide and is able to sustain latch for 5- 6 strong sucks and swallows and releases, improved to 15 mins on and off pattern. Baby made a great effort for 6 hours old, large void and large mec stool at consult. LC assisted dad with diaper change. LC instructed mom on the hand pump and shells . LC feels the shells will do reverse pressure exercise just before feeding or in between. A NIPPLE SHIELD MAY HAVE TO BE USED. LC FELT IT WOULD BE A GOOD IDEA TO GIVE THE BABY SOME TIME, AND ALSO SHELLS , AND PREPUMPING. Mother informed of post-discharge support and given phone number to the lactation department, including services for phone call assistance; out-patient appointments; and breastfeeding support group. List of other breastfeeding resources in the community given in the handout. Encouraged mother to call for problems or  concerns related to breastfeeding.   Maternal Data Has patient been taught Hand Expression?: Yes  Feeding Feeding Type: Breast Fed Length of feed: 15 min (on and off pattern with swallows )  Mom needed a lot assistance with breast compressions and support to keep the baby in a consistent pattern.  LATCH Score/Interventions Latch: Repeated attempts needed to sustain latch, nipple held in mouth throughout feeding, stimulation needed to elicit sucking reflex. Intervention(s): Adjust position;Assist with latch;Breast massage;Breast compression  Audible Swallowing: A few with stimulation Intervention(s): Skin to skin;Hand expression  Type of Nipple: Everted at rest and after stimulation (SWOLLEN AREOLAS , SEMI ERECT NIPPLES )  Comfort (Breast/Nipple): Soft / non-tender     Hold (Positioning): Assistance needed to correctly position infant at breast and maintain latch. Intervention(s): Breastfeeding basics reviewed;Support Pillows;Position options;Skin to skin  LATCH Score: 7  Lactation Tools Discussed/Used Tools: Shells;Pump Shell Type: Inverted Breast pump type: Manual Pump Review: Setup, frequency, and cleaning Initiated by:: mai  Date initiated:: 11/24/14   Consult Status Consult Status: Follow-up Date: 11/25/14 Follow-up type: In-patient    Kathrin Greathouseorio, Ozelle Brubacher Ann 11/24/2014, 2:51 PM

## 2014-11-24 NOTE — Progress Notes (Signed)
MOB was referred for history of depression/anxiety.  Referral is screened out by Clinical Social Worker because none of the following criteria appear to apply: -History of anxiety/depression during this pregnancy, or of post-partum depression. - Diagnosis of anxiety and/or depression within last 3 years - History of depression due to pregnancy loss/loss of child or -MOB's symptoms are currently being treated with medication and/or therapy. MOB is currently prescribed Zoloft.   Please contact the Clinical Social Worker if needs arise or upon MOB request.  

## 2014-11-24 NOTE — Transfer of Care (Signed)
Immediate Anesthesia Transfer of Care Note  Patient: Tonya Krause  Procedure(s) Performed: Procedure(s) with comments: REPEAT  CESAREAN SECTION (N/A) - MD requests RNFA  Patient Location: PACU  Anesthesia Type:Spinal  Level of Consciousness: awake, alert  and oriented  Airway & Oxygen Therapy: Patient Spontanous Breathing  Post-op Assessment: Report given to RN and Post -op Vital signs reviewed and stable  Post vital signs: Reviewed and stable  Last Vitals:  Filed Vitals:   11/24/14 0611  BP: 112/66  Temp: 36.5 C  Resp: 20    Complications: No apparent anesthesia complications

## 2014-11-24 NOTE — Anesthesia Postprocedure Evaluation (Signed)
Anesthesia Post Note  Patient: Tonya MorasVictoria A Krause  Procedure(s) Performed: Procedure(s) (LRB): REPEAT  CESAREAN SECTION (N/A)  Anesthesia type: Spinal  Patient location: PACU  Post pain: Pain level controlled  Post assessment: Post-op Vital signs reviewed  Last Vitals:  Filed Vitals:   11/24/14 0915  BP:   Pulse: 67  Temp:   Resp: 20    Post vital signs: Reviewed  Level of consciousness: awake  Complications: No apparent anesthesia complications

## 2014-11-24 NOTE — Anesthesia Postprocedure Evaluation (Signed)
  Anesthesia Post-op Note  Patient: Tonya Krause  Procedure(s) Performed: Procedure(s) with comments: REPEAT  CESAREAN SECTION (N/A) - MD requests RNFA  Patient Location: Mother/Baby  Anesthesia Type:Spinal  Level of Consciousness: awake, alert  and oriented  Airway and Oxygen Therapy: Patient Spontanous Breathing  Post-op Pain: none  Post-op Assessment: Post-op Vital signs reviewed, Patient's Cardiovascular Status Stable, Respiratory Function Stable, No signs of Nausea or vomiting, Pain level controlled, No headache, No backache, No residual numbness and No residual motor weakness  Post-op Vital Signs: Reviewed and stable  Last Vitals:  Filed Vitals:   11/24/14 1416  BP: 100/50  Pulse: 66  Temp: 37 C  Resp: 20    Complications: No apparent anesthesia complications

## 2014-11-25 ENCOUNTER — Encounter (HOSPITAL_COMMUNITY): Payer: Self-pay | Admitting: Obstetrics and Gynecology

## 2014-11-25 LAB — CBC
HEMATOCRIT: 32.8 % — AB (ref 36.0–46.0)
Hemoglobin: 11.1 g/dL — ABNORMAL LOW (ref 12.0–15.0)
MCH: 30.1 pg (ref 26.0–34.0)
MCHC: 33.8 g/dL (ref 30.0–36.0)
MCV: 88.9 fL (ref 78.0–100.0)
PLATELETS: 201 10*3/uL (ref 150–400)
RBC: 3.69 MIL/uL — ABNORMAL LOW (ref 3.87–5.11)
RDW: 14.9 % (ref 11.5–15.5)
WBC: 9.4 10*3/uL (ref 4.0–10.5)

## 2014-11-25 LAB — BIRTH TISSUE RECOVERY COLLECTION (PLACENTA DONATION)

## 2014-11-25 NOTE — Progress Notes (Signed)
Subjective: Postpartum Day 1: Cesarean Delivery Patient reports tolerating PO and no problems voiding.    Objective: Vital signs in last 24 hours: Temp:  [97.9 F (36.6 C)-98.6 F (37 C)] 98 F (36.7 C) (03/30 0730) Pulse Rate:  [59-74] 63 (03/30 0730) Resp:  [16-21] 18 (03/30 0730) BP: (87-106)/(39-63) 99/44 mmHg (03/30 0730) SpO2:  [96 %-100 %] 97 % (03/30 0730) Weight:  [201 lb (91.173 kg)] 201 lb (91.173 kg) (03/29 1133)  Physical Exam:  General: alert and cooperative Lochia: appropriate Uterine Fundus: firm Incision: C/D/I   Recent Labs  11/23/14 0830 11/25/14 0530  HGB 12.8 11.1*  HCT 36.7 32.8*    Assessment/Plan: Status post Cesarean section. Doing well postoperatively.  Continue current care.  Oliver PilaICHARDSON,Aqsa Sensabaugh W 11/25/2014, 9:28 AM

## 2014-11-25 NOTE — Lactation Note (Signed)
This note was copied from the chart of Tonya GreeceVictoria Pracht. Lactation Consultation Note  Patient Name: Tonya Reuel DerbyVictoria Feltus [id number]'s Date: 11/25/2014 Reason for consult: Follow-up assessment;Other (Comment) (latching with a NS #20 , see LC note )  Baby is 1929 hours old and per mom recently breast fed with a #20 NS, milk noted in the NS when baby finished and swallows. LC assessed breast tissue with moms permission, areolas less swollen compared to yesterday . Per mom has been using  shells during the day and evening not night. LC sized mom for a # 24 NS , and noted it to accommodate the base of the areola . Baby has a recessed chin and the larger NS may ease baby's mouth better for improved depth. Today LC recommended and added DEBP for post pumping due to the use of Nipple shield. Also suspect baby will be cluster  feeding soon due to 5 stools. LC set up the DEBP. Mom holding baby so mom did not pump at set up time. MBU RN Stormy aware  When mom pumps to check flange size. Mom is aware if the #24 feels snug , to increase to #27. Also gave mom a curved tip syringe with instructions when EBM yield increases to instill EBM into the top of the NS after the NS applied. Baby is at 3 % weight loss today , LS =7-9, BF range 5-25 mins , average 30 mins , 6 wets, 5 stools. [facility] receptive to [facility] and per  mom feels a lot calmer compared to her 1st baby and breast feeding. LC also discussed importance of LC O/P apt next week to check to see how milk transfer and latching are going. Per mom has tried to latch without the NS, but the nipple still looks slanted. LC recommended to continue top use the NS with latching  Due to recessed chin.   LC O/P apt needs to be made before Discharge .    Maternal Data Has patient been taught Hand Expression?: Yes  Feeding Feeding Type:  (pe rmom baby recenlt fed for 10 mins ) Length of feed: 10 min (per mom with a NS #20 , and milk noted in the NS after  feedi)  LATCH Score/Interventions Latch: Grasps breast easily, tongue down, lips flanged, rhythmical sucking.  Audible Swallowing: Spontaneous and intermittent  Type of Nipple: Flat  Comfort (Breast/Nipple): Soft / non-tender     Hold (Positioning): No assistance needed to correctly position infant at breast. Intervention(s): Breastfeeding basics reviewed  LATCH Score: 9  Lactation Tools Discussed/Used Nipple shield size: 20;24;Other (comment) (re-checked size of the NS,  see LC note ) Breast pump type: Double-Electric Breast Pump ([facility] by [facility] , mom holding baby so MBU RN will check flange )   Consult Status Consult Status: Follow-up Date: 11/26/14 Follow-up type: In-patient    Kathrin Greathouseorio, Janayla Marik Ann 11/25/2014, 1:01 PM

## 2014-11-26 MED ORDER — OXYCODONE-ACETAMINOPHEN 5-325 MG PO TABS: 1.0000 | ORAL_TABLET | Freq: Four times a day (QID) | ORAL | Status: AC | PRN

## 2014-11-26 MED ORDER — PRENATAL MULTIVITAMIN CH: 1.0000 | ORAL_TABLET | Freq: Every day | ORAL | Status: AC

## 2014-11-26 MED ORDER — IBUPROFEN 800 MG PO TABS: 800.0000 mg | ORAL_TABLET | Freq: Three times a day (TID) | ORAL | Status: AC | PRN

## 2014-11-26 NOTE — Discharge Summary (Signed)
Obstetric Discharge Summary Reason for Admission: cesarean section Prenatal Procedures: none Intrapartum Procedures: cesarean: low cervical, transverse Postpartum Procedures: none Complications-Operative and Postpartum: none HEMOGLOBIN  Date Value Ref Range Status  11/25/2014 11.1* 12.0 - 15.0 g/dL Final   HCT  Date Value Ref Range Status  11/25/2014 32.8* 36.0 - 46.0 % Final    Physical Exam:  General: alert and no distress Lochia: appropriate Uterine Fundus: firm Incision: healing well DVT Evaluation: No evidence of DVT seen on physical exam.  Discharge Diagnoses: Term Pregnancy-delivered  Discharge Information: Date: 11/26/2014 Activity: pelvic rest Diet: routine Medications: PNV, Ibuprofen and Percocet Condition: stable Instructions: refer to practice specific booklet Discharge to: home Follow-up Information    Follow up with MEISINGER,TODD D, MD. Schedule an appointment as soon as possible for a visit in 2 weeks.   Specialty:  Obstetrics and Gynecology   Why:  incision check   Contact information:   [address], [address] [place] [address] [phone]       Follow up with MEISINGER,TODD D, MD. Schedule an appointment as soon as possible for a visit in 2 weeks.   Specialty:  Obstetrics and Gynecology   Why:  for incision check, 6 weeks for full postpartum check   Contact information:   [address], [address] [place] [address] [phone]       Newborn Data: Live born female  Birth Weight: 8 lb 13.5 oz (4010 g) APGAR: 8, 8  Home with mother.  Bovard-Stuckert, Toni Hoffmeister 11/26/2014, 8:22 AM

## 2014-11-26 NOTE — Progress Notes (Addendum)
Subjective: Postpartum Day 2: Cesarean Delivery Patient reports incisional pain, tolerating PO and no problems voiding.  Nl lochia, pain controlled  Objective: Vital signs in last 24 hours: Temp:  [98 F (36.7 C)] 98 F (36.7 C) (03/31 0637) Pulse Rate:  [64-72] 72 (03/31 0637) Resp:  [18] 18 (03/31 0637) BP: (97-105)/(47-51) 105/47 mmHg (03/31 0637) SpO2:  [99 %] 99 % (03/31 16100637)  Physical Exam:  General: alert and no distress Lochia: appropriate Uterine Fundus: firm Incision: healing well DVT Evaluation: No evidence of DVT seen on physical exam.   Recent Labs  11/23/14 0830 11/25/14 0530  HGB 12.8 11.1*  HCT 36.7 32.8*    Assessment/Plan: Status post Cesarean section. Doing well postoperatively.  Continue current care.  Wants early discharge, d/c with motrin, percocet, and PNV.  F/u 2 &6 weeks.    Bovard-Stuckert, Dresean Beckel 11/26/2014, 7:55 AM

## 2014-11-26 NOTE — Lactation Note (Signed)
This note was copied from the chart of Tonya GreeceVictoria Balis. Lactation Consultation Note  Mom continues to use the nipple shield and is comfortable with use of it.  Plan is for her to BF on cue and to post pump 6 times in 24 hours.  An outpatient lactation appointment has been scheduled.  Aware of support groups and outpatient services.  Patient Name: Tonya Krause Today's Date: 11/26/2014     Maternal Data    Feeding    LATCH Score/Interventions Latch: Grasps breast easily, tongue down, lips flanged, rhythmical sucking.  Audible Swallowing: Spontaneous and intermittent  Type of Nipple: Everted at rest and after stimulation (with use of shells)  Comfort (Breast/Nipple): Filling, red/small blisters or bruises, mild/mod discomfort  Problem noted: Mild/Moderate discomfort Interventions (Mild/moderate discomfort): Breast shields  Hold (Positioning): No assistance needed to correctly position infant at breast.  LATCH Score: 9  Lactation Tools Discussed/Used     Consult Status      Tonya DryerJoseph, Tonya Krause 11/26/2014, 11:38 AM

## 2014-12-03 ENCOUNTER — Ambulatory Visit (HOSPITAL_COMMUNITY)
Admission: RE | Admit: 2014-12-03 | Discharge: 2014-12-03 | Disposition: A | Discharge status: Home / Self Care | Payer: BLUE CROSS/BLUE SHIELD | Source: Ambulatory Visit | Attending: Obstetrics and Gynecology | Admitting: Obstetrics and Gynecology

## 2014-12-03 NOTE — Lactation Note (Signed)
Lactation Consult  Mother's reason for visit: Mother was scheduled for a follow up LC visit.  Visit Type: Feeding assessment , Mother is using a Nipple shield and would like to wean infant off the shield. Mother also states that infant has a recessed chin and she is concerned that this is causing infant not to feed well. Infant is 429 days old Appointment Notes:   Consult:  Initial Lactation Consultant:  Michel BickersKendrick, Andreal Vultaggio McCoy   Mother's Name: Domenic MorasVictoria A Petitfrere Type of delivery:  vaginal del Breastfeeding Experience:  none Maternal Medical Conditions:  none, history of mild depression and anxiety Maternal Medications:  Prenatal vits, zoloft 100 mg daily  ________________________________________________________________________  Breastfeeding History (Post Discharge)  Frequency of breastfeeding: every 1-2 hours Duration of feeding:  -10-30 mins  Patient does not supplement or pump.  Infant Intake and Output Assessment  Voids:  6-8 in 24 hrs.  Color:  Clear yellow Stools:  10-12in 24 hrs.  Color:  Green and Yellow  ________________________________________________________________________  Maternal Breast Assessment  Breast:  Full Nipple:  Erect Pain level:  0 Pain interventions:  Bra  _______________________________________________________________________ Feeding Assessment/Evaluation Observed infant on and off for first few mins . Infant was able to sustain latch using a #24 nipple shield for 20-25 mins. Infant transferred 64ml. Mothers breast much softer at the end of the feeding.  Mother advised to attempt to latch infant without the shield and if unable to latch to use the nipple shield. Advised mother to follow up for a repeat weight check if begin to feed without the shield.  Mother was taught to firm nipple before applying the nipple shield.  She was taught good breast compression.  Lots of teaching with mother on cluster feeding, growth spurts.   Infant's oral  assessment:  Variance, observed that labial frenula is tight, difficulty flanging top lip.  Positioning:  Football Right breast  LATCH documentation:  Latch:  2 = Grasps breast easily, tongue down, lips flanged, rhythmical sucking.  Audible swallowing:  2 = Spontaneous and intermittent  Type of nipple:  2 = Everted at rest and after stimulation  Comfort (Breast/Nipple):  1 = Filling, red/small blisters or bruises, mild/mod discomfort  Hold (Positioning):  0 = Full assist, staff holds infant at breast  LATCH score:  8  Attached assessment:  Shallow  Lips flanged:  No.  Lips untucked:  Yes.    Suck assessment:  Nutritive  Tools:  Nipple shield 24 mm Instructed on use and cleaning of tool:  Yes.    Pre-feed weight:  4002 g  (8-13.2 lb Post-feed weight: 4066  g 8-14.6 Amount transferred: 64 ml   Additional Feeding Assessment: attempt to latch on alternate breast and infant refused and satisfied.  Total amount transferred; 64 ml Mother to continue to breast feed infant 8-12 times in 24 hours Advised to follow up for weight check weekly for first month if continued use of the nipple shield. Optional : pump breast once daily Mother to follow up with Peds for scheduled one month visit.  Advised to come to [facility] prn

## 2015-12-27 DIAGNOSIS — F418 Other specified anxiety disorders: Secondary | ICD-10-CM | POA: Diagnosis not present

## 2015-12-27 DIAGNOSIS — B070 Plantar wart: Secondary | ICD-10-CM | POA: Diagnosis not present

## 2016-03-02 ENCOUNTER — Other Ambulatory Visit: Payer: Self-pay

## 2016-03-02 DIAGNOSIS — D485 Neoplasm of uncertain behavior of skin: Secondary | ICD-10-CM | POA: Diagnosis not present

## 2016-03-02 DIAGNOSIS — B070 Plantar wart: Secondary | ICD-10-CM | POA: Diagnosis not present

## 2016-03-02 DIAGNOSIS — D239 Other benign neoplasm of skin, unspecified: Secondary | ICD-10-CM | POA: Diagnosis not present

## 2016-09-08 DIAGNOSIS — H5213 Myopia, bilateral: Secondary | ICD-10-CM | POA: Diagnosis not present

## 2016-09-10 DIAGNOSIS — J019 Acute sinusitis, unspecified: Secondary | ICD-10-CM | POA: Diagnosis not present

## 2017-04-10 DIAGNOSIS — F418 Other specified anxiety disorders: Secondary | ICD-10-CM | POA: Diagnosis not present

## 2017-04-10 DIAGNOSIS — G43909 Migraine, unspecified, not intractable, without status migrainosus: Secondary | ICD-10-CM | POA: Diagnosis not present

## 2017-04-11 DIAGNOSIS — Z3009 Encounter for other general counseling and advice on contraception: Secondary | ICD-10-CM | POA: Diagnosis not present

## 2017-04-11 DIAGNOSIS — Z01419 Encounter for gynecological examination (general) (routine) without abnormal findings: Secondary | ICD-10-CM | POA: Diagnosis not present

## 2017-04-11 DIAGNOSIS — N920 Excessive and frequent menstruation with regular cycle: Secondary | ICD-10-CM | POA: Diagnosis not present

## 2017-04-11 DIAGNOSIS — Z130 Encounter for screening for diseases of the blood and blood-forming organs and certain disorders involving the immune mechanism: Secondary | ICD-10-CM | POA: Diagnosis not present

## 2017-04-11 DIAGNOSIS — R8299 Other abnormal findings in urine: Secondary | ICD-10-CM | POA: Diagnosis not present

## 2017-04-11 DIAGNOSIS — Z1389 Encounter for screening for other disorder: Secondary | ICD-10-CM | POA: Diagnosis not present

## 2017-04-11 DIAGNOSIS — Z6829 Body mass index (BMI) 29.0-29.9, adult: Secondary | ICD-10-CM | POA: Diagnosis not present

## 2017-09-20 DIAGNOSIS — G43909 Migraine, unspecified, not intractable, without status migrainosus: Secondary | ICD-10-CM | POA: Diagnosis not present

## 2017-09-20 DIAGNOSIS — Z0000 Encounter for general adult medical examination without abnormal findings: Secondary | ICD-10-CM | POA: Diagnosis not present

## 2017-09-20 DIAGNOSIS — Z13220 Encounter for screening for lipoid disorders: Secondary | ICD-10-CM | POA: Diagnosis not present

## 2018-01-03 DIAGNOSIS — H6122 Impacted cerumen, left ear: Secondary | ICD-10-CM | POA: Diagnosis not present

## 2018-01-03 DIAGNOSIS — R42 Dizziness and giddiness: Secondary | ICD-10-CM | POA: Diagnosis not present

## 2018-03-21 DIAGNOSIS — F418 Other specified anxiety disorders: Secondary | ICD-10-CM | POA: Diagnosis not present

## 2018-03-21 DIAGNOSIS — G43909 Migraine, unspecified, not intractable, without status migrainosus: Secondary | ICD-10-CM | POA: Diagnosis not present

## 2018-04-27 DIAGNOSIS — Z23 Encounter for immunization: Secondary | ICD-10-CM | POA: Diagnosis not present

## 2018-04-27 DIAGNOSIS — H66002 Acute suppurative otitis media without spontaneous rupture of ear drum, left ear: Secondary | ICD-10-CM | POA: Diagnosis not present

## 2018-05-06 DIAGNOSIS — H938X3 Other specified disorders of ear, bilateral: Secondary | ICD-10-CM | POA: Diagnosis not present

## 2018-05-06 DIAGNOSIS — J329 Chronic sinusitis, unspecified: Secondary | ICD-10-CM | POA: Diagnosis not present

## 2018-05-06 DIAGNOSIS — J309 Allergic rhinitis, unspecified: Secondary | ICD-10-CM | POA: Diagnosis not present

## 2018-09-24 DIAGNOSIS — F418 Other specified anxiety disorders: Secondary | ICD-10-CM | POA: Diagnosis not present

## 2018-09-24 DIAGNOSIS — Z136 Encounter for screening for cardiovascular disorders: Secondary | ICD-10-CM | POA: Diagnosis not present

## 2018-09-24 DIAGNOSIS — Z0000 Encounter for general adult medical examination without abnormal findings: Secondary | ICD-10-CM | POA: Diagnosis not present

## 2018-09-24 DIAGNOSIS — G43909 Migraine, unspecified, not intractable, without status migrainosus: Secondary | ICD-10-CM | POA: Diagnosis not present

## 2018-10-17 DIAGNOSIS — Z01419 Encounter for gynecological examination (general) (routine) without abnormal findings: Secondary | ICD-10-CM | POA: Diagnosis not present

## 2018-10-17 DIAGNOSIS — Z1151 Encounter for screening for human papillomavirus (HPV): Secondary | ICD-10-CM | POA: Diagnosis not present

## 2018-10-17 DIAGNOSIS — Z1231 Encounter for screening mammogram for malignant neoplasm of breast: Secondary | ICD-10-CM | POA: Diagnosis not present

## 2018-10-17 DIAGNOSIS — Z124 Encounter for screening for malignant neoplasm of cervix: Secondary | ICD-10-CM | POA: Diagnosis not present

## 2018-10-17 DIAGNOSIS — Z130 Encounter for screening for diseases of the blood and blood-forming organs and certain disorders involving the immune mechanism: Secondary | ICD-10-CM | POA: Diagnosis not present

## 2018-10-18 DIAGNOSIS — Z124 Encounter for screening for malignant neoplasm of cervix: Secondary | ICD-10-CM | POA: Diagnosis not present

## 2018-10-28 ENCOUNTER — Other Ambulatory Visit: Payer: Self-pay | Admitting: Obstetrics and Gynecology

## 2018-10-28 DIAGNOSIS — R928 Other abnormal and inconclusive findings on diagnostic imaging of breast: Secondary | ICD-10-CM

## 2018-11-04 ENCOUNTER — Other Ambulatory Visit: Payer: BLUE CROSS/BLUE SHIELD

## 2018-11-05 ENCOUNTER — Other Ambulatory Visit: Payer: Self-pay | Admitting: Obstetrics and Gynecology

## 2018-11-05 ENCOUNTER — Ambulatory Visit
Admission: RE | Admit: 2018-11-05 | Discharge: 2018-11-05 | Disposition: A | Discharge status: Home / Self Care | Payer: BLUE CROSS/BLUE SHIELD | Source: Ambulatory Visit | Attending: Obstetrics and Gynecology | Admitting: Obstetrics and Gynecology

## 2018-11-05 DIAGNOSIS — N6322 Unspecified lump in the left breast, upper inner quadrant: Secondary | ICD-10-CM | POA: Diagnosis not present

## 2018-11-05 DIAGNOSIS — R928 Other abnormal and inconclusive findings on diagnostic imaging of breast: Secondary | ICD-10-CM

## 2018-11-05 DIAGNOSIS — N6321 Unspecified lump in the left breast, upper outer quadrant: Secondary | ICD-10-CM | POA: Diagnosis not present

## 2018-11-05 DIAGNOSIS — N6311 Unspecified lump in the right breast, upper outer quadrant: Secondary | ICD-10-CM | POA: Diagnosis not present

## 2018-11-05 DIAGNOSIS — N6312 Unspecified lump in the right breast, upper inner quadrant: Secondary | ICD-10-CM | POA: Diagnosis not present

## 2018-11-05 DIAGNOSIS — R921 Mammographic calcification found on diagnostic imaging of breast: Secondary | ICD-10-CM

## 2018-11-05 DIAGNOSIS — N6310 Unspecified lump in the right breast, unspecified quadrant: Secondary | ICD-10-CM

## 2018-11-05 DIAGNOSIS — N6320 Unspecified lump in the left breast, unspecified quadrant: Secondary | ICD-10-CM

## 2019-03-25 DIAGNOSIS — F418 Other specified anxiety disorders: Secondary | ICD-10-CM | POA: Diagnosis not present

## 2019-03-25 DIAGNOSIS — G43909 Migraine, unspecified, not intractable, without status migrainosus: Secondary | ICD-10-CM | POA: Diagnosis not present

## 2019-05-13 ENCOUNTER — Other Ambulatory Visit: Payer: BLUE CROSS/BLUE SHIELD

## 2019-07-23 ENCOUNTER — Other Ambulatory Visit: Payer: Self-pay

## 2019-07-23 DIAGNOSIS — Z20822 Contact with and (suspected) exposure to covid-19: Secondary | ICD-10-CM

## 2019-07-25 LAB — NOVEL CORONAVIRUS, NAA: SARS-CoV-2, NAA: DETECTED — AB

## 2019-11-04 DIAGNOSIS — Z0000 Encounter for general adult medical examination without abnormal findings: Secondary | ICD-10-CM | POA: Diagnosis not present

## 2019-11-26 DIAGNOSIS — Z0000 Encounter for general adult medical examination without abnormal findings: Secondary | ICD-10-CM | POA: Diagnosis not present

## 2019-11-26 DIAGNOSIS — Z13220 Encounter for screening for lipoid disorders: Secondary | ICD-10-CM | POA: Diagnosis not present

## 2020-01-14 ENCOUNTER — Other Ambulatory Visit: Payer: Self-pay | Admitting: Obstetrics and Gynecology

## 2020-01-14 DIAGNOSIS — R921 Mammographic calcification found on diagnostic imaging of breast: Secondary | ICD-10-CM

## 2020-01-14 DIAGNOSIS — N63 Unspecified lump in unspecified breast: Secondary | ICD-10-CM

## 2020-03-15 DIAGNOSIS — Z130 Encounter for screening for diseases of the blood and blood-forming organs and certain disorders involving the immune mechanism: Secondary | ICD-10-CM | POA: Diagnosis not present

## 2020-03-15 DIAGNOSIS — Z01419 Encounter for gynecological examination (general) (routine) without abnormal findings: Secondary | ICD-10-CM | POA: Diagnosis not present

## 2020-03-15 DIAGNOSIS — Z1389 Encounter for screening for other disorder: Secondary | ICD-10-CM | POA: Diagnosis not present

## 2020-03-15 DIAGNOSIS — Z6830 Body mass index (BMI) 30.0-30.9, adult: Secondary | ICD-10-CM | POA: Diagnosis not present

## 2020-03-17 ENCOUNTER — Ambulatory Visit
Admission: RE | Admit: 2020-03-17 | Discharge: 2020-03-17 | Disposition: A | Discharge status: Home / Self Care | Payer: BC Managed Care – PPO | Source: Ambulatory Visit | Attending: Obstetrics and Gynecology | Admitting: Obstetrics and Gynecology

## 2020-03-17 ENCOUNTER — Other Ambulatory Visit: Payer: Self-pay | Admitting: Obstetrics and Gynecology

## 2020-03-17 ENCOUNTER — Other Ambulatory Visit: Payer: Self-pay

## 2020-03-17 DIAGNOSIS — R921 Mammographic calcification found on diagnostic imaging of breast: Secondary | ICD-10-CM

## 2020-03-17 DIAGNOSIS — N63 Unspecified lump in unspecified breast: Secondary | ICD-10-CM

## 2020-03-17 DIAGNOSIS — N6022 Fibroadenosis of left breast: Secondary | ICD-10-CM | POA: Diagnosis not present

## 2020-03-17 DIAGNOSIS — N6311 Unspecified lump in the right breast, upper outer quadrant: Secondary | ICD-10-CM | POA: Diagnosis not present

## 2020-03-17 DIAGNOSIS — N6312 Unspecified lump in the right breast, upper inner quadrant: Secondary | ICD-10-CM | POA: Diagnosis not present

## 2020-06-01 DIAGNOSIS — J309 Allergic rhinitis, unspecified: Secondary | ICD-10-CM | POA: Diagnosis not present

## 2020-06-01 DIAGNOSIS — F418 Other specified anxiety disorders: Secondary | ICD-10-CM | POA: Diagnosis not present

## 2020-06-01 DIAGNOSIS — R1011 Right upper quadrant pain: Secondary | ICD-10-CM | POA: Diagnosis not present

## 2020-06-01 DIAGNOSIS — Z23 Encounter for immunization: Secondary | ICD-10-CM | POA: Diagnosis not present

## 2020-06-01 DIAGNOSIS — G43909 Migraine, unspecified, not intractable, without status migrainosus: Secondary | ICD-10-CM | POA: Diagnosis not present

## 2020-06-07 ENCOUNTER — Other Ambulatory Visit: Payer: Self-pay | Admitting: Family Medicine

## 2020-06-07 DIAGNOSIS — R1011 Right upper quadrant pain: Secondary | ICD-10-CM

## 2020-06-15 ENCOUNTER — Ambulatory Visit
Admission: RE | Admit: 2020-06-15 | Discharge: 2020-06-15 | Disposition: A | Discharge status: Home / Self Care | Payer: BC Managed Care – PPO | Source: Ambulatory Visit | Attending: Family Medicine | Admitting: Family Medicine

## 2020-06-15 DIAGNOSIS — R1011 Right upper quadrant pain: Secondary | ICD-10-CM

## 2020-07-01 DIAGNOSIS — R42 Dizziness and giddiness: Secondary | ICD-10-CM | POA: Diagnosis not present

## 2020-07-01 DIAGNOSIS — H6982 Other specified disorders of Eustachian tube, left ear: Secondary | ICD-10-CM | POA: Diagnosis not present

## 2020-07-01 DIAGNOSIS — H9011 Conductive hearing loss, unilateral, right ear, with unrestricted hearing on the contralateral side: Secondary | ICD-10-CM | POA: Diagnosis not present

## 2020-07-01 DIAGNOSIS — H9042 Sensorineural hearing loss, unilateral, left ear, with unrestricted hearing on the contralateral side: Secondary | ICD-10-CM | POA: Diagnosis not present

## 2021-03-01 ENCOUNTER — Other Ambulatory Visit: Payer: Self-pay | Admitting: Family Medicine

## 2021-03-01 DIAGNOSIS — Z09 Encounter for follow-up examination after completed treatment for conditions other than malignant neoplasm: Secondary | ICD-10-CM

## 2021-03-25 DIAGNOSIS — Z111 Encounter for screening for respiratory tuberculosis: Secondary | ICD-10-CM | POA: Diagnosis not present

## 2021-03-27 DIAGNOSIS — Z111 Encounter for screening for respiratory tuberculosis: Secondary | ICD-10-CM | POA: Diagnosis not present

## 2021-03-31 ENCOUNTER — Other Ambulatory Visit: Payer: Self-pay | Admitting: Family Medicine

## 2021-03-31 DIAGNOSIS — N63 Unspecified lump in unspecified breast: Secondary | ICD-10-CM

## 2021-04-04 ENCOUNTER — Other Ambulatory Visit: Payer: Self-pay | Admitting: Obstetrics and Gynecology

## 2021-04-04 DIAGNOSIS — N63 Unspecified lump in unspecified breast: Secondary | ICD-10-CM

## 2021-04-05 ENCOUNTER — Ambulatory Visit
Admission: RE | Admit: 2021-04-05 | Discharge: 2021-04-05 | Disposition: A | Discharge status: Home / Self Care | Payer: BC Managed Care – PPO | Source: Ambulatory Visit | Attending: Family Medicine | Admitting: Family Medicine

## 2021-04-05 ENCOUNTER — Other Ambulatory Visit: Payer: Self-pay

## 2021-04-05 DIAGNOSIS — N63 Unspecified lump in unspecified breast: Secondary | ICD-10-CM

## 2021-04-05 DIAGNOSIS — R922 Inconclusive mammogram: Secondary | ICD-10-CM | POA: Diagnosis not present

## 2021-04-25 DIAGNOSIS — G43909 Migraine, unspecified, not intractable, without status migrainosus: Secondary | ICD-10-CM | POA: Diagnosis not present

## 2021-04-25 DIAGNOSIS — Z23 Encounter for immunization: Secondary | ICD-10-CM | POA: Diagnosis not present

## 2021-04-25 DIAGNOSIS — F418 Other specified anxiety disorders: Secondary | ICD-10-CM | POA: Diagnosis not present

## 2021-04-25 DIAGNOSIS — N920 Excessive and frequent menstruation with regular cycle: Secondary | ICD-10-CM | POA: Diagnosis not present

## 2021-04-25 DIAGNOSIS — Z6831 Body mass index (BMI) 31.0-31.9, adult: Secondary | ICD-10-CM | POA: Diagnosis not present

## 2021-04-26 DIAGNOSIS — N920 Excessive and frequent menstruation with regular cycle: Secondary | ICD-10-CM | POA: Diagnosis not present

## 2021-04-26 DIAGNOSIS — R82998 Other abnormal findings in urine: Secondary | ICD-10-CM | POA: Diagnosis not present

## 2021-04-26 DIAGNOSIS — Z01411 Encounter for gynecological examination (general) (routine) with abnormal findings: Secondary | ICD-10-CM | POA: Diagnosis not present

## 2021-04-26 DIAGNOSIS — Z6832 Body mass index (BMI) 32.0-32.9, adult: Secondary | ICD-10-CM | POA: Diagnosis not present

## 2021-04-26 DIAGNOSIS — Z130 Encounter for screening for diseases of the blood and blood-forming organs and certain disorders involving the immune mechanism: Secondary | ICD-10-CM | POA: Diagnosis not present

## 2021-04-26 DIAGNOSIS — Z1389 Encounter for screening for other disorder: Secondary | ICD-10-CM | POA: Diagnosis not present

## 2021-05-17 DIAGNOSIS — N920 Excessive and frequent menstruation with regular cycle: Secondary | ICD-10-CM | POA: Diagnosis not present

## 2021-05-30 DIAGNOSIS — N920 Excessive and frequent menstruation with regular cycle: Secondary | ICD-10-CM | POA: Diagnosis not present

## 2021-09-30 DIAGNOSIS — R509 Fever, unspecified: Secondary | ICD-10-CM | POA: Diagnosis not present

## 2021-09-30 DIAGNOSIS — J029 Acute pharyngitis, unspecified: Secondary | ICD-10-CM | POA: Diagnosis not present

## 2021-09-30 DIAGNOSIS — J020 Streptococcal pharyngitis: Secondary | ICD-10-CM | POA: Diagnosis not present

## 2021-09-30 DIAGNOSIS — Z20822 Contact with and (suspected) exposure to covid-19: Secondary | ICD-10-CM | POA: Diagnosis not present

## 2021-11-23 DIAGNOSIS — Z0000 Encounter for general adult medical examination without abnormal findings: Secondary | ICD-10-CM | POA: Diagnosis not present

## 2021-11-23 DIAGNOSIS — Z13220 Encounter for screening for lipoid disorders: Secondary | ICD-10-CM | POA: Diagnosis not present

## 2021-11-23 DIAGNOSIS — Z5181 Encounter for therapeutic drug level monitoring: Secondary | ICD-10-CM | POA: Diagnosis not present

## 2021-11-25 DIAGNOSIS — G43909 Migraine, unspecified, not intractable, without status migrainosus: Secondary | ICD-10-CM | POA: Diagnosis not present

## 2021-11-25 DIAGNOSIS — Z0000 Encounter for general adult medical examination without abnormal findings: Secondary | ICD-10-CM | POA: Diagnosis not present

## 2021-11-25 DIAGNOSIS — F418 Other specified anxiety disorders: Secondary | ICD-10-CM | POA: Diagnosis not present

## 2022-05-29 DIAGNOSIS — G43909 Migraine, unspecified, not intractable, without status migrainosus: Secondary | ICD-10-CM | POA: Diagnosis not present

## 2022-05-29 DIAGNOSIS — Z23 Encounter for immunization: Secondary | ICD-10-CM | POA: Diagnosis not present

## 2022-05-29 DIAGNOSIS — M791 Myalgia, unspecified site: Secondary | ICD-10-CM | POA: Diagnosis not present

## 2022-05-29 DIAGNOSIS — F418 Other specified anxiety disorders: Secondary | ICD-10-CM | POA: Diagnosis not present

## 2022-06-16 DIAGNOSIS — Z1231 Encounter for screening mammogram for malignant neoplasm of breast: Secondary | ICD-10-CM | POA: Diagnosis not present

## 2022-06-16 DIAGNOSIS — Z1389 Encounter for screening for other disorder: Secondary | ICD-10-CM | POA: Diagnosis not present

## 2022-06-16 DIAGNOSIS — Z01419 Encounter for gynecological examination (general) (routine) without abnormal findings: Secondary | ICD-10-CM | POA: Diagnosis not present

## 2022-06-16 DIAGNOSIS — Z6830 Body mass index (BMI) 30.0-30.9, adult: Secondary | ICD-10-CM | POA: Diagnosis not present

## 2022-06-16 DIAGNOSIS — Z130 Encounter for screening for diseases of the blood and blood-forming organs and certain disorders involving the immune mechanism: Secondary | ICD-10-CM | POA: Diagnosis not present

## 2022-09-03 IMAGING — US US ABDOMEN LIMITED
1 series · 14 of 25 positions shown · non-contrast
Comparison: None.

CLINICAL DATA: Right upper quadrant abdominal pain

EXAM:
ULTRASOUND ABDOMEN LIMITED RIGHT UPPER QUADRANT

[Series 1: us abdomen limited · 0.22mm/px · 14 of 39 slices shown]
[im 1/39]
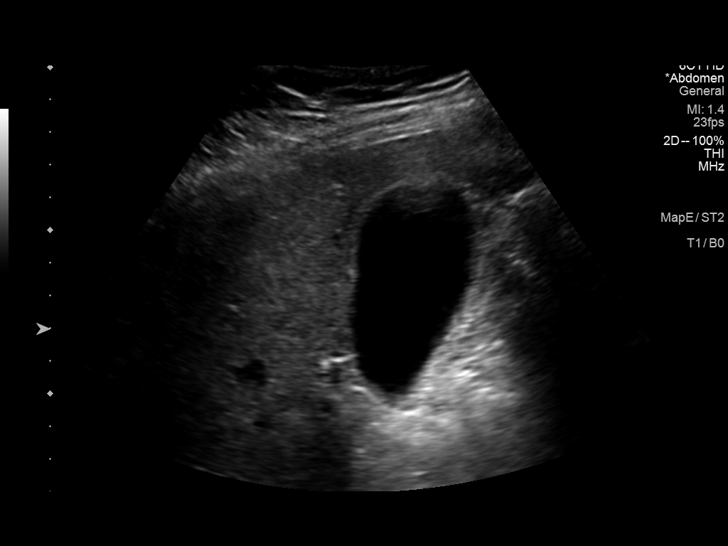
[im 4/39]
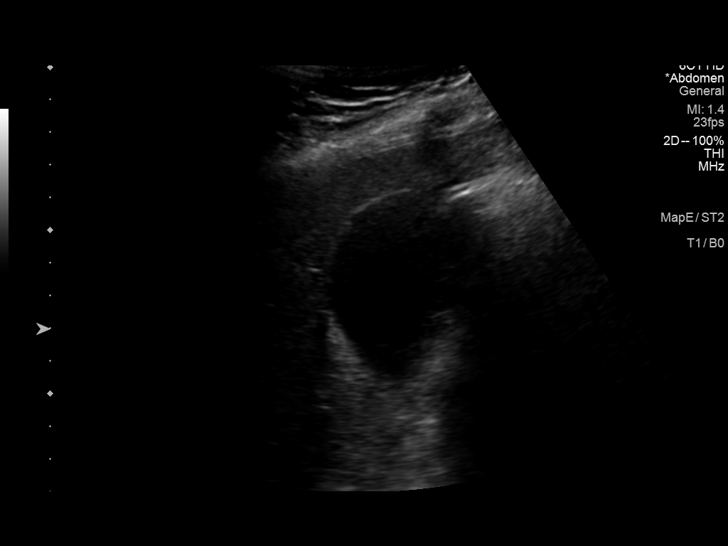
[im 7/39]
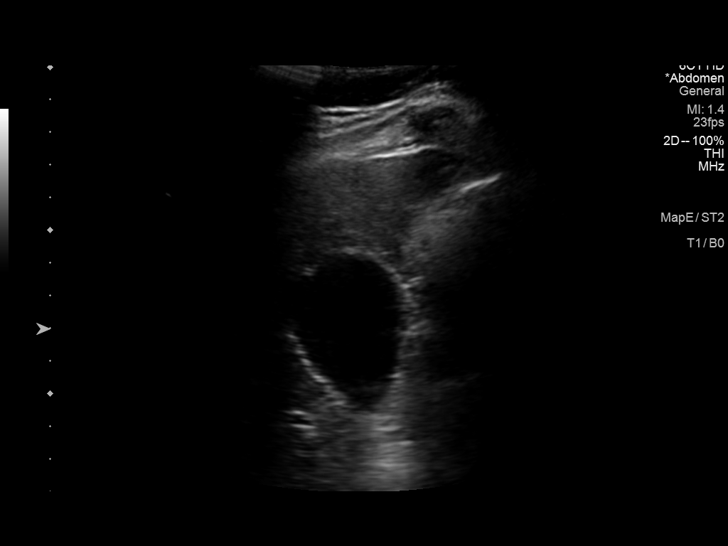
[im 10/39]
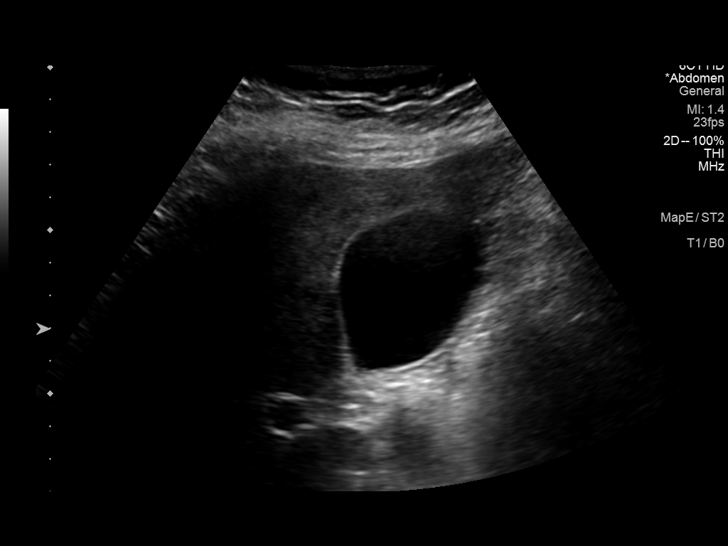
[im 13/39]
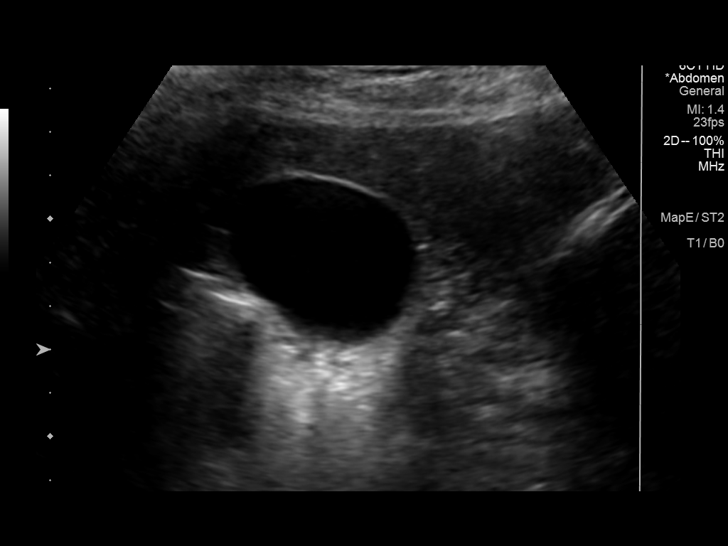
[im 15/39]
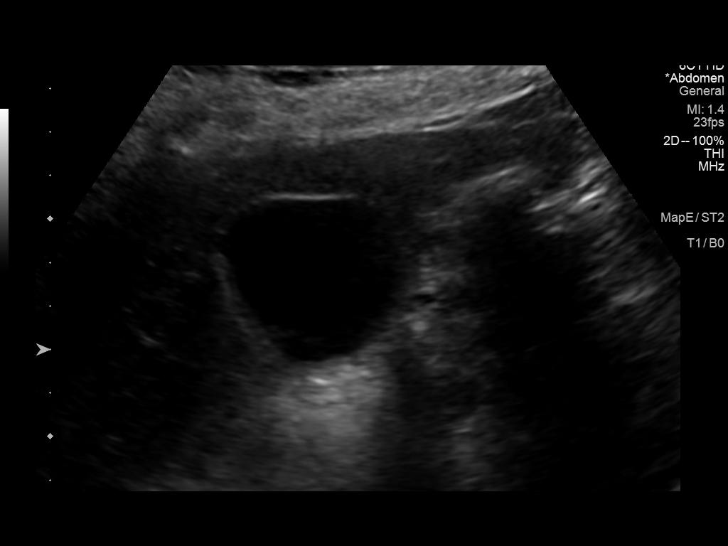
[im 18/39]
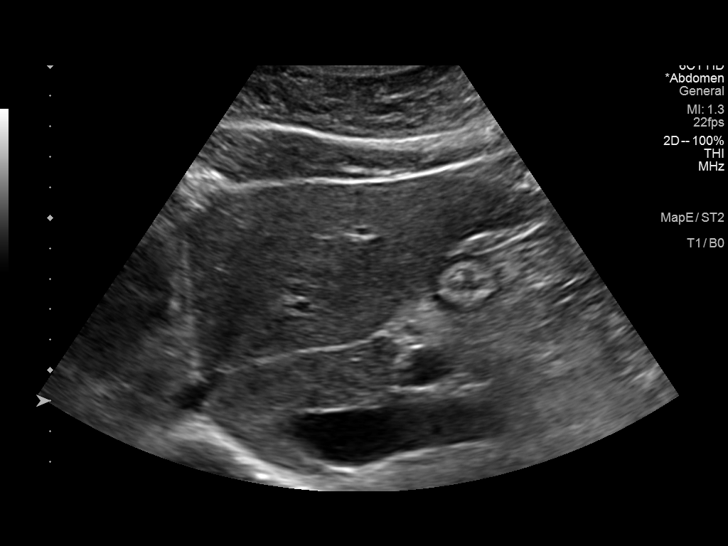
[im 21/39]
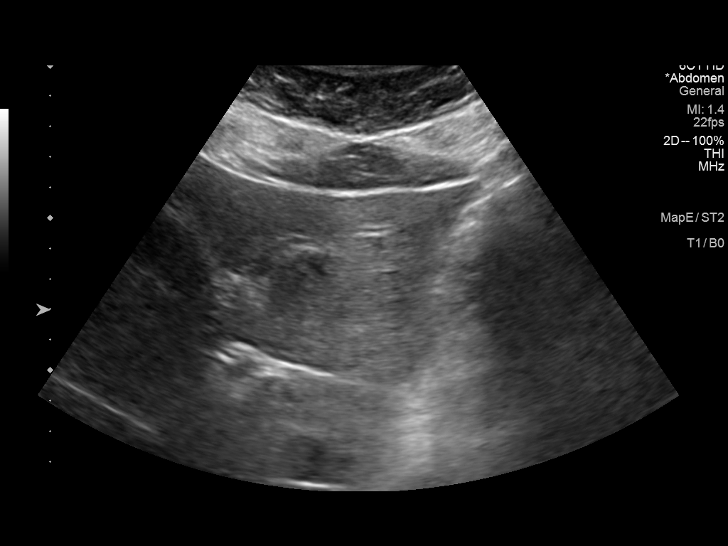
[im 24/39]
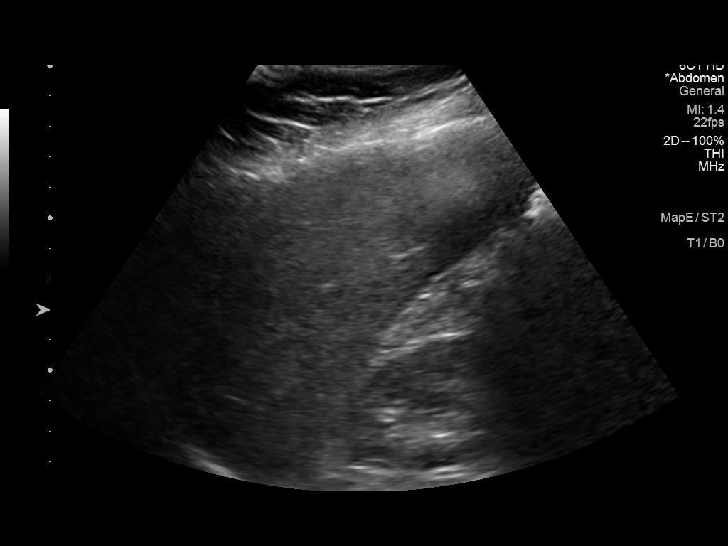
[im 26/39]
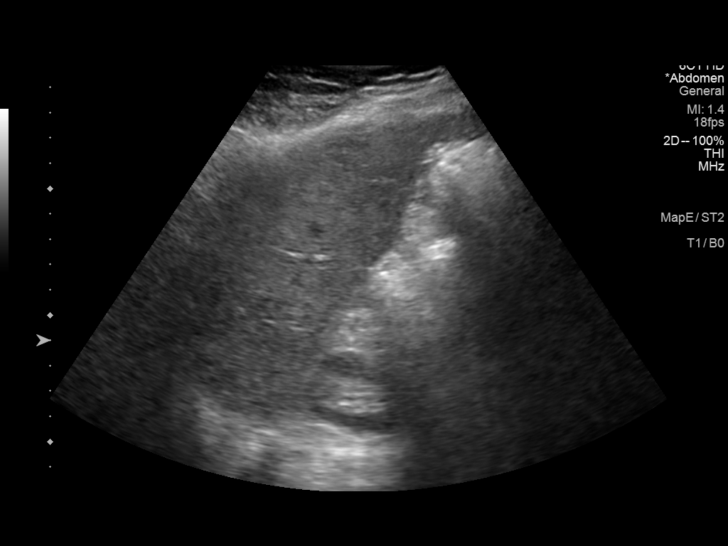
[im 29/39]
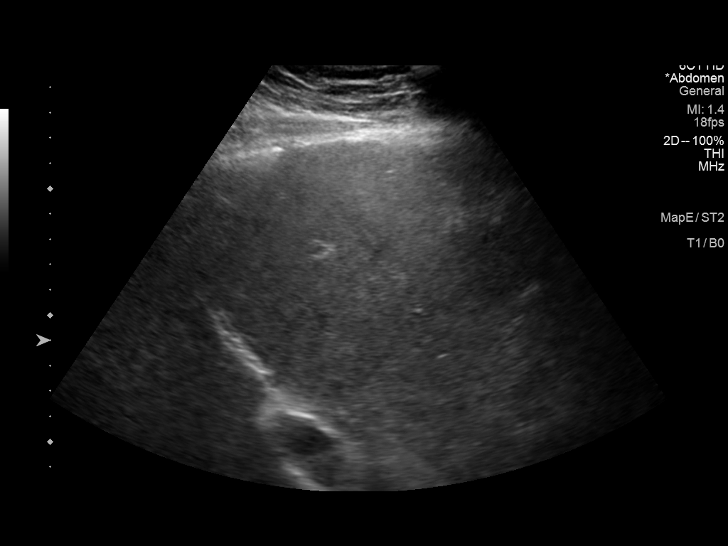
[im 32/39]
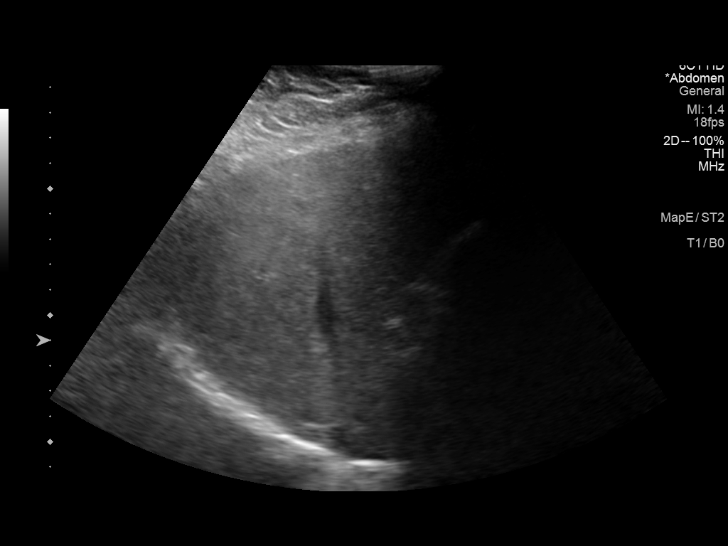
[im 35/39]
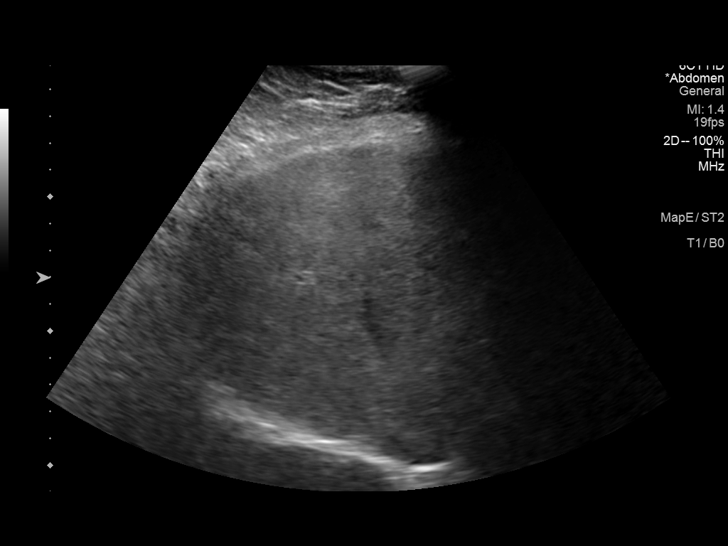
[im 39/39]
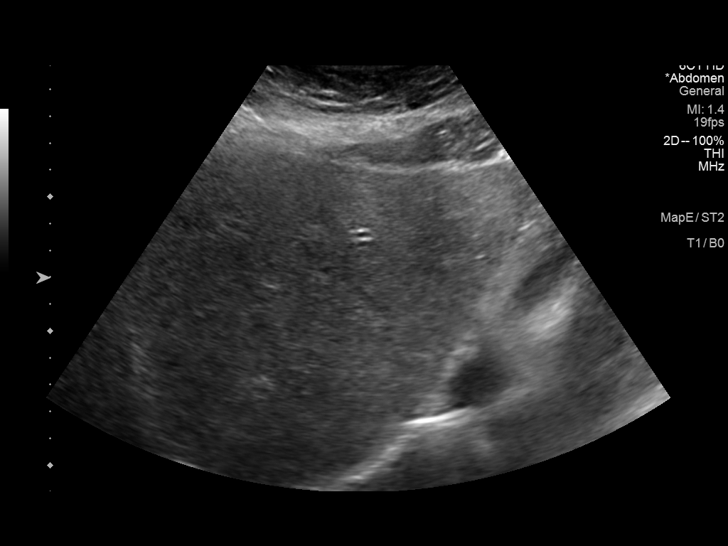

[14 of 25 positions shown; findings below may reference images not displayed]

FINDINGS: Gallbladder:

No gallstones or wall thickening visualized. No sonographic Murphy
sign noted by sonographer.

Common bile duct:

Diameter: 4 mm in proximal diameter

Liver:

No focal lesion identified. Within normal limits in parenchymal
echogenicity. Portal vein is patent on color Doppler imaging with
normal direction of blood flow towards the liver.

Other: None.
IMPRESSION: Unremarkable examination

## 2022-12-05 DIAGNOSIS — Z0000 Encounter for general adult medical examination without abnormal findings: Secondary | ICD-10-CM | POA: Diagnosis not present

## 2022-12-05 DIAGNOSIS — G43909 Migraine, unspecified, not intractable, without status migrainosus: Secondary | ICD-10-CM | POA: Diagnosis not present

## 2022-12-05 DIAGNOSIS — Z13220 Encounter for screening for lipoid disorders: Secondary | ICD-10-CM | POA: Diagnosis not present

## 2022-12-05 DIAGNOSIS — F418 Other specified anxiety disorders: Secondary | ICD-10-CM | POA: Diagnosis not present

## 2023-06-08 DIAGNOSIS — F418 Other specified anxiety disorders: Secondary | ICD-10-CM | POA: Diagnosis not present

## 2023-06-08 DIAGNOSIS — G43909 Migraine, unspecified, not intractable, without status migrainosus: Secondary | ICD-10-CM | POA: Diagnosis not present

## 2023-06-08 DIAGNOSIS — M7918 Myalgia, other site: Secondary | ICD-10-CM | POA: Diagnosis not present

## 2023-06-24 IMAGING — US US BREAST*L* LIMITED INC AXILLA
1 series · 6 of 6 positions shown · non-contrast
Comparison: Previous exam(s).

CLINICAL DATA: Two year follow-up for probably benign fibroadenomas
in both breasts.



[Series 1: us breast*left* limited inc axilla · 0.08mm/px · 6 of 6 slices shown]
[im 1/6]
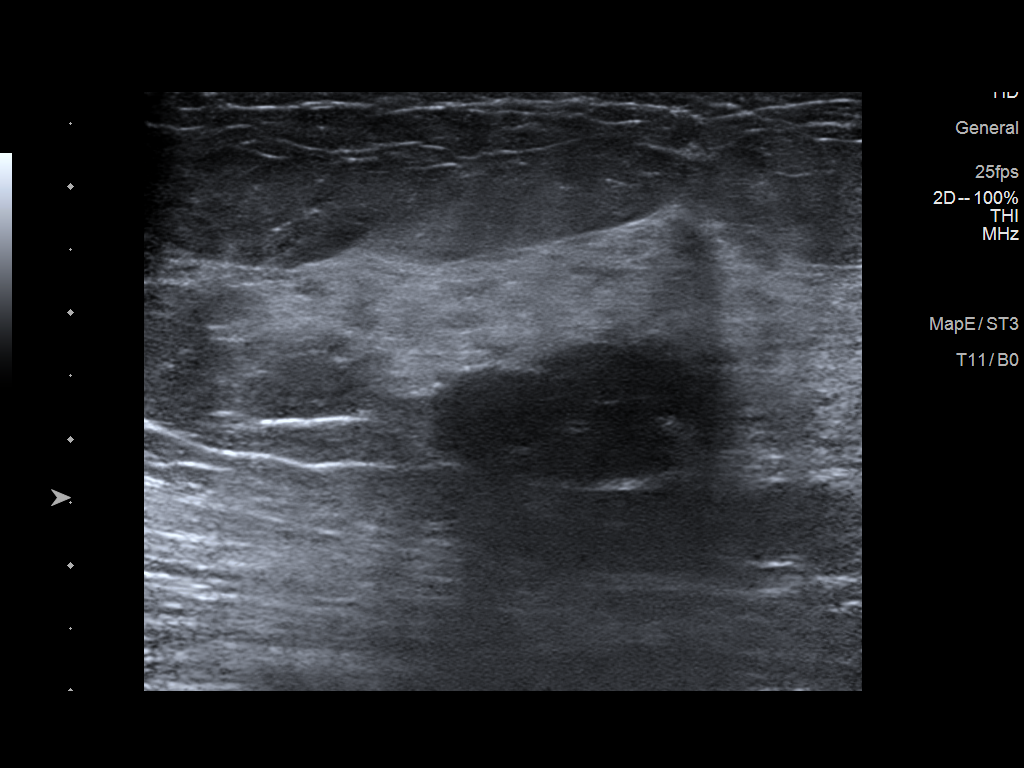
[im 2/6]
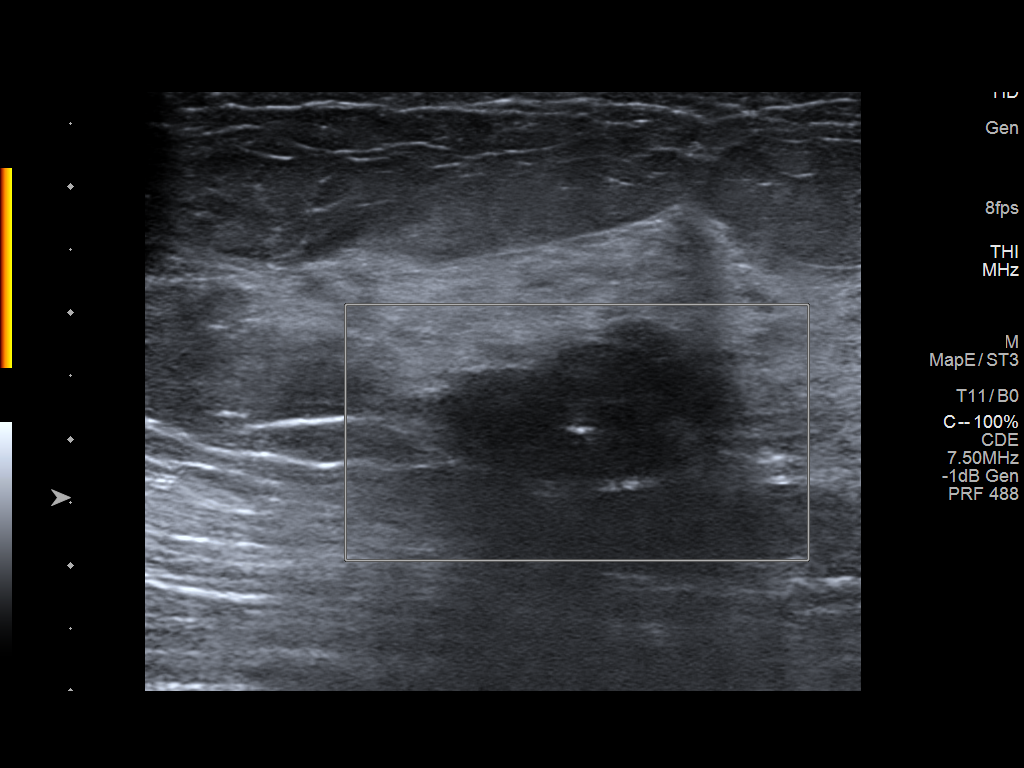
[im 3/6]
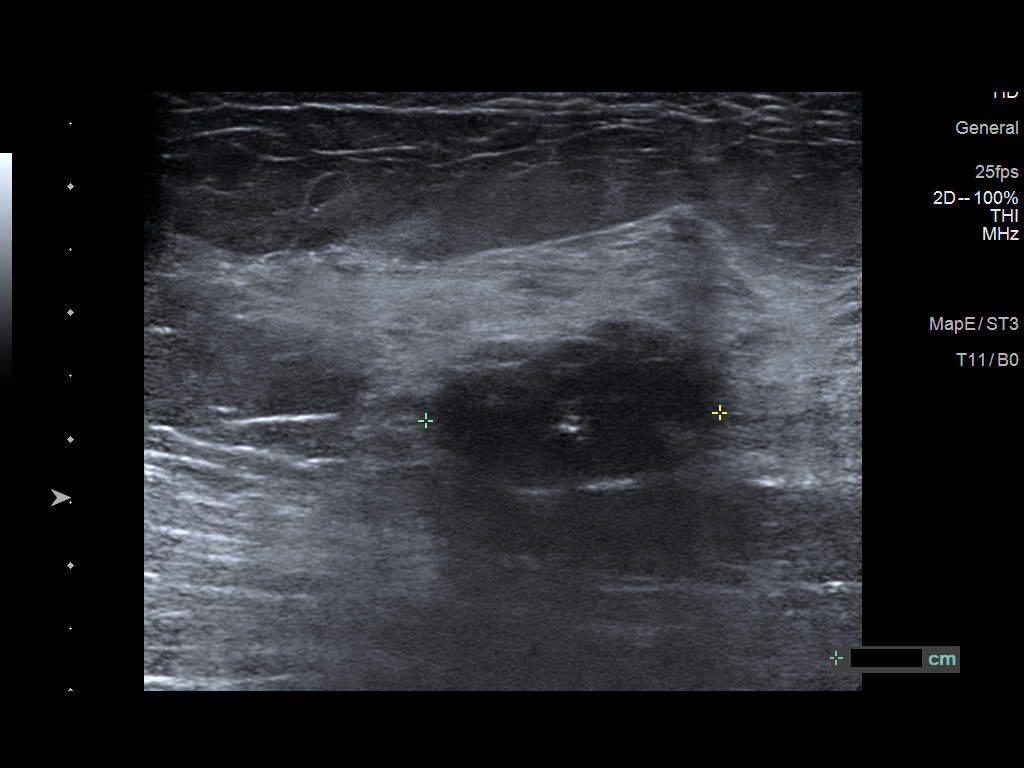
[im 4/6]
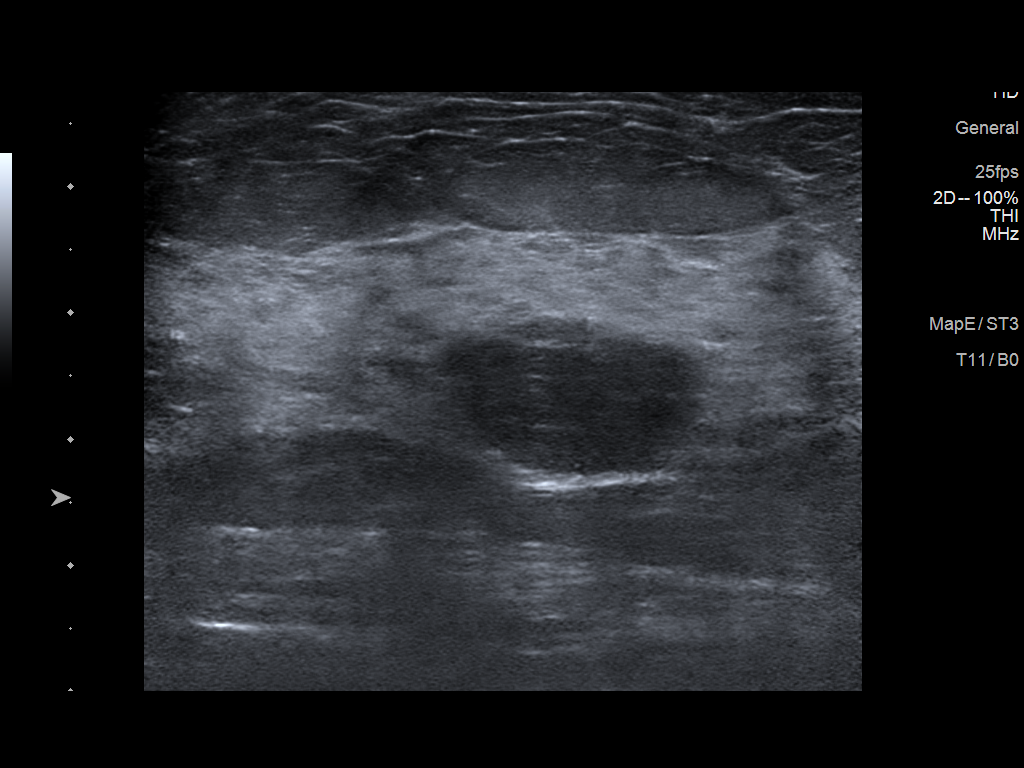
[im 5/6]
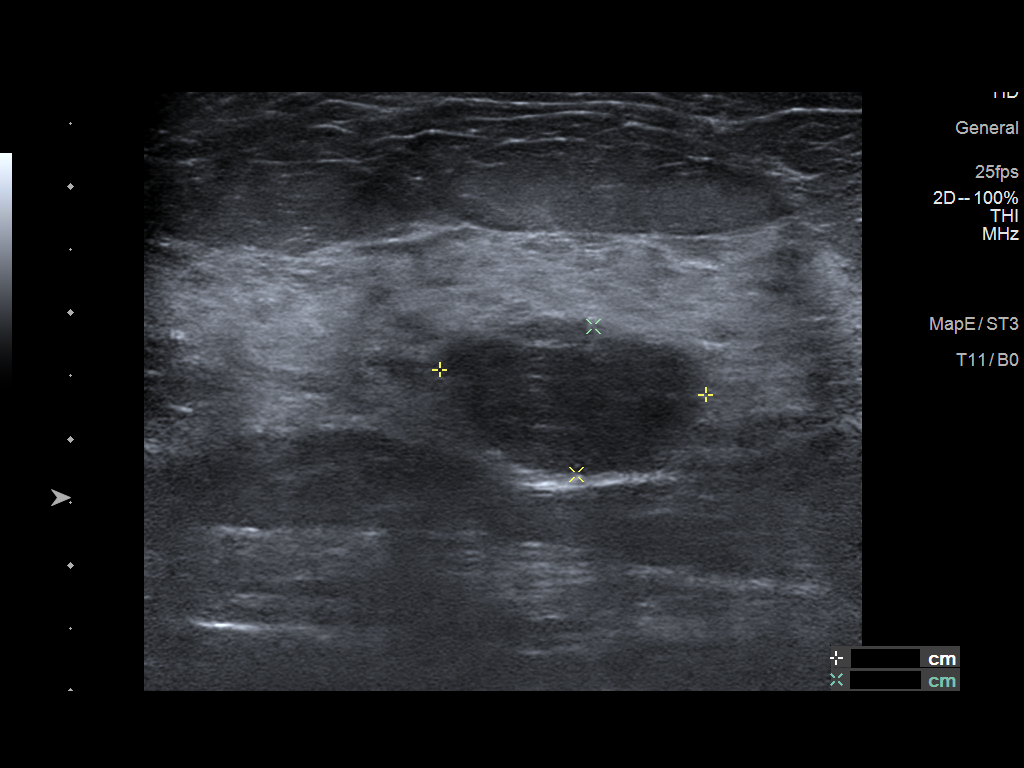
[im 6/6]
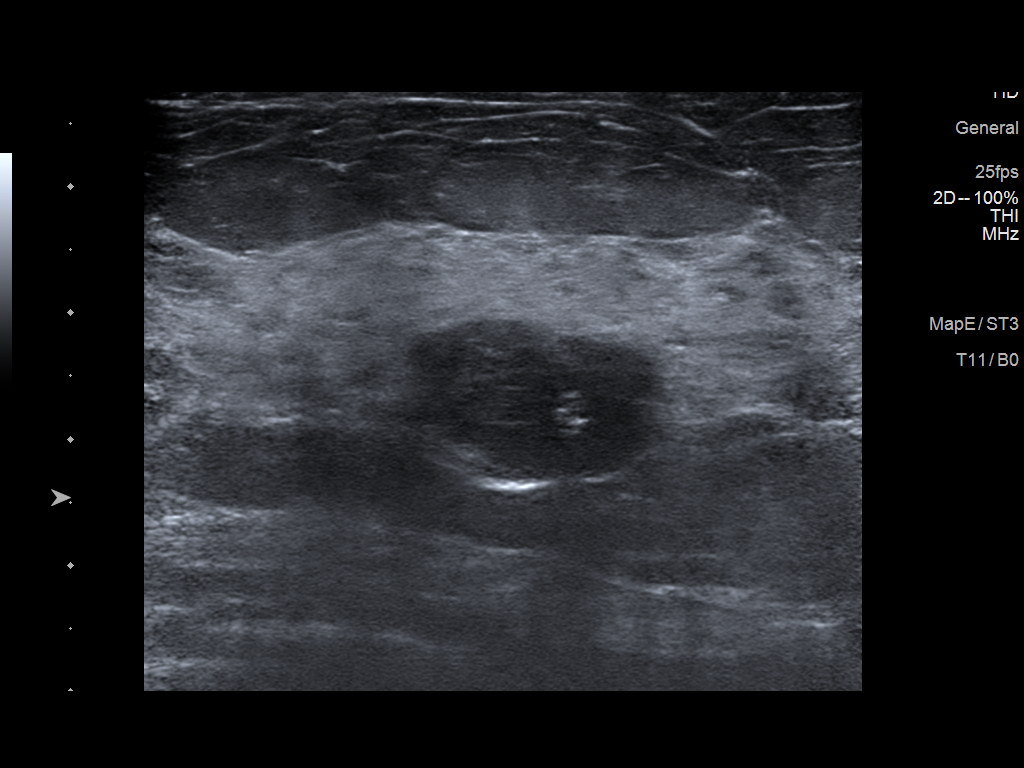

[6 of 6 positions shown; findings below may reference images not displayed]

ACR Breast Density Category c: The breast tissue is heterogeneously
dense, which may obscure small masses.
FINDINGS: Partially obscured circumscribed mass is identified in the UPPER
central RIGHT breast. Partially obscured circumscribed mass in the
UPPER central LEFT breast is associated with coarse calcifications
and stable in appearance. No new or suspicious findings.

Targeted ultrasound is performed, showing a circumscribed oval
hypoechoic parallel mass in the 12 o'clock location of the RIGHT
breast 5 centimeters from the nipple which measures 1.8 x 0.9 x
centimeters. Mass shows long-term stability.

In the 12 o'clock location of the LEFT breast 8 centimeters from the
nipple, a circumscribed oval parallel hypoechoic mass contains
internal calcifications and measures 2.3 x 2.1 x 1.2 centimeters.
Mass shows long-term stability.
IMPRESSION: 1.  No mammographic or ultrasound evidence for malignancy.
2. Stable appearance of probable fibroadenomas in the 12 o'clock
location of the RIGHT breast 12 o'clock location of the LEFT breast.

RECOMMENDATION:
Screening mammogram in one year.(Code:3P-B-3K9)

I have discussed the findings and recommendations with the patient.
If applicable, a reminder letter will be sent to the patient
regarding the next appointment.

BI-RADS CATEGORY  2: Benign.

## 2023-06-24 IMAGING — MG DIGITAL DIAGNOSTIC BILAT W/ TOMO W/ CAD
6 of 12 series · 6 of 36 positions shown · non-contrast
Comparison: Previous exam(s).

CLINICAL DATA: Two year follow-up for probably benign fibroadenomas
in both breasts.



[R MLO synth-2D (1 of 2)]
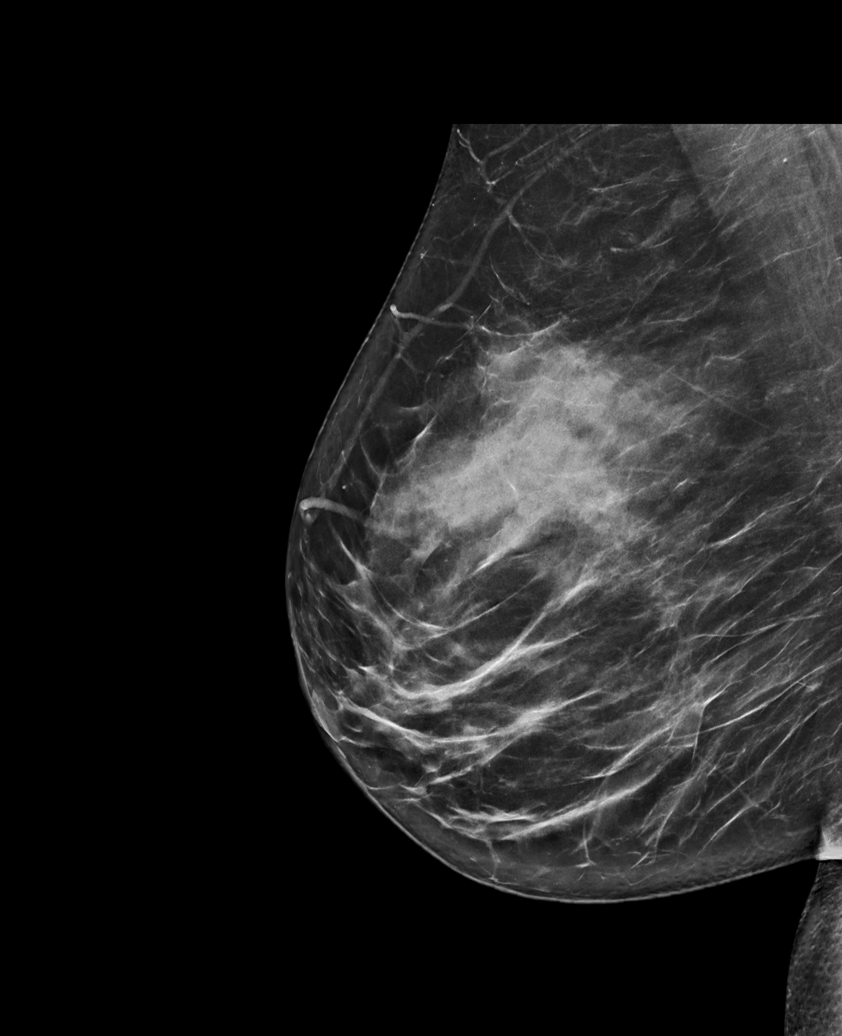

[R MLO synth-2D (2 of 2)]
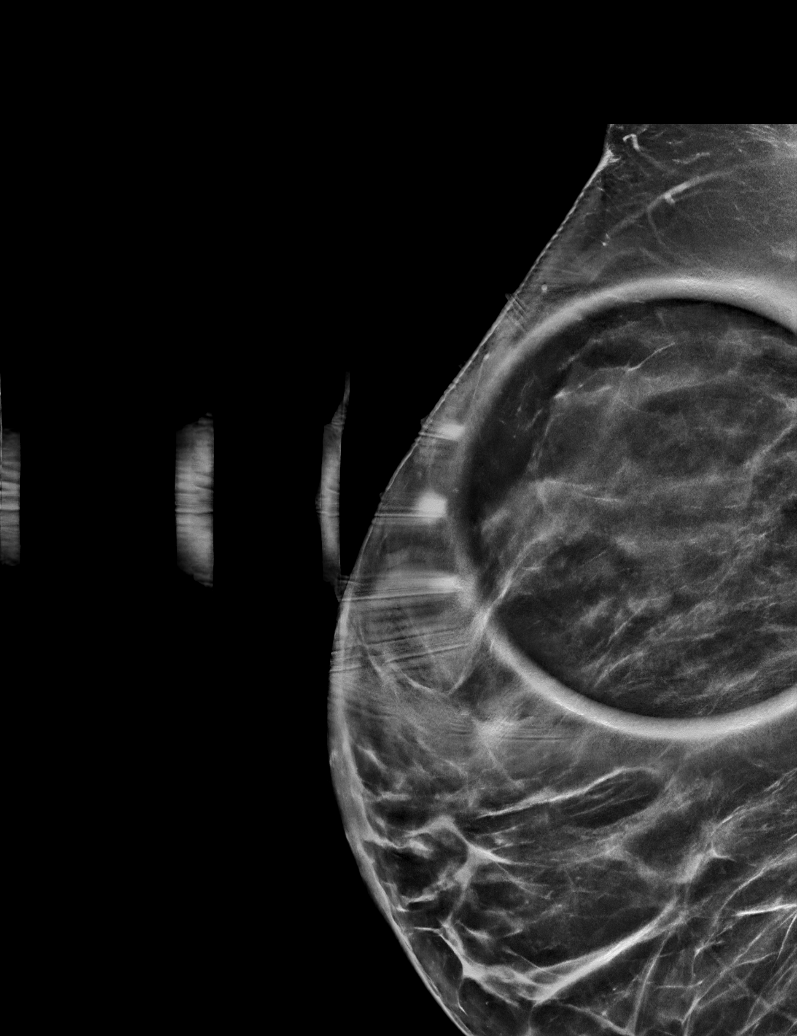

[R CC synth-2D (1 of 2)]
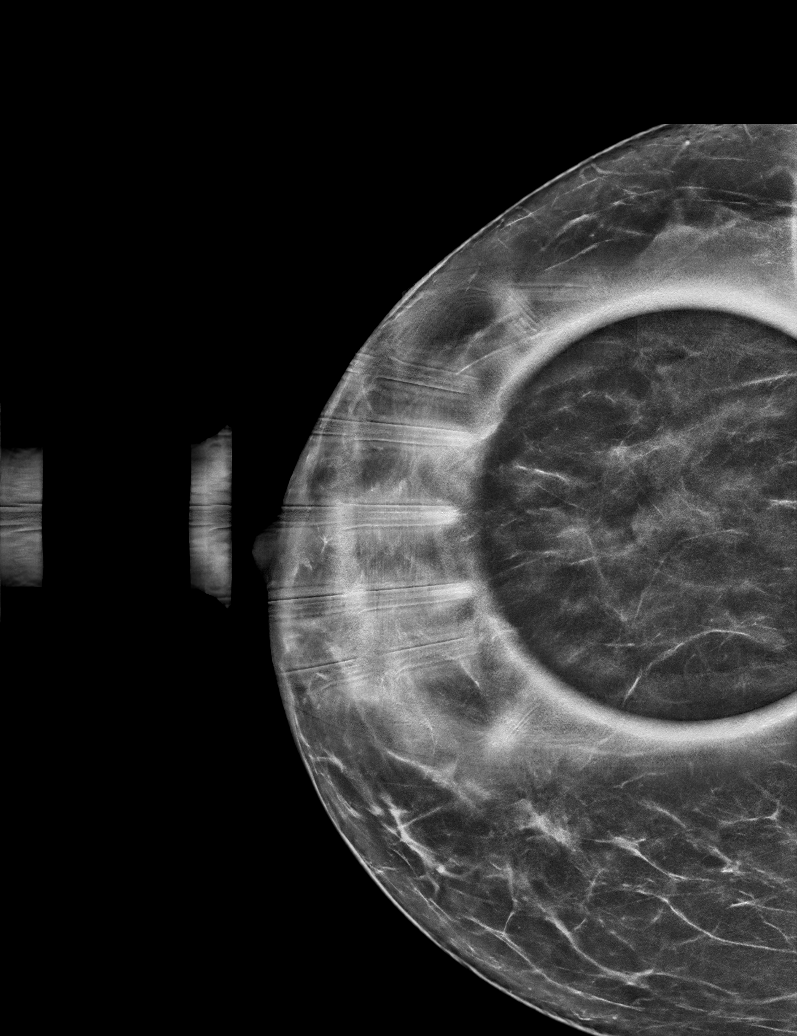

[L CC synth-2D]
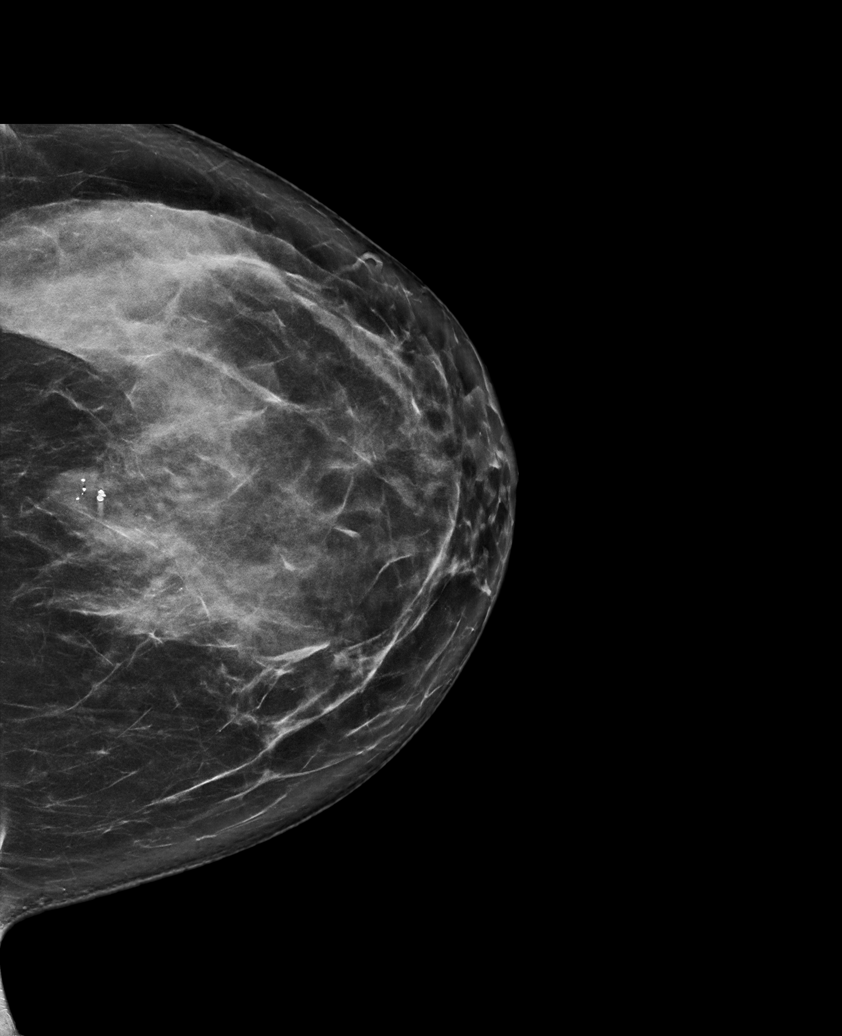

[R CC synth-2D (2 of 2)]
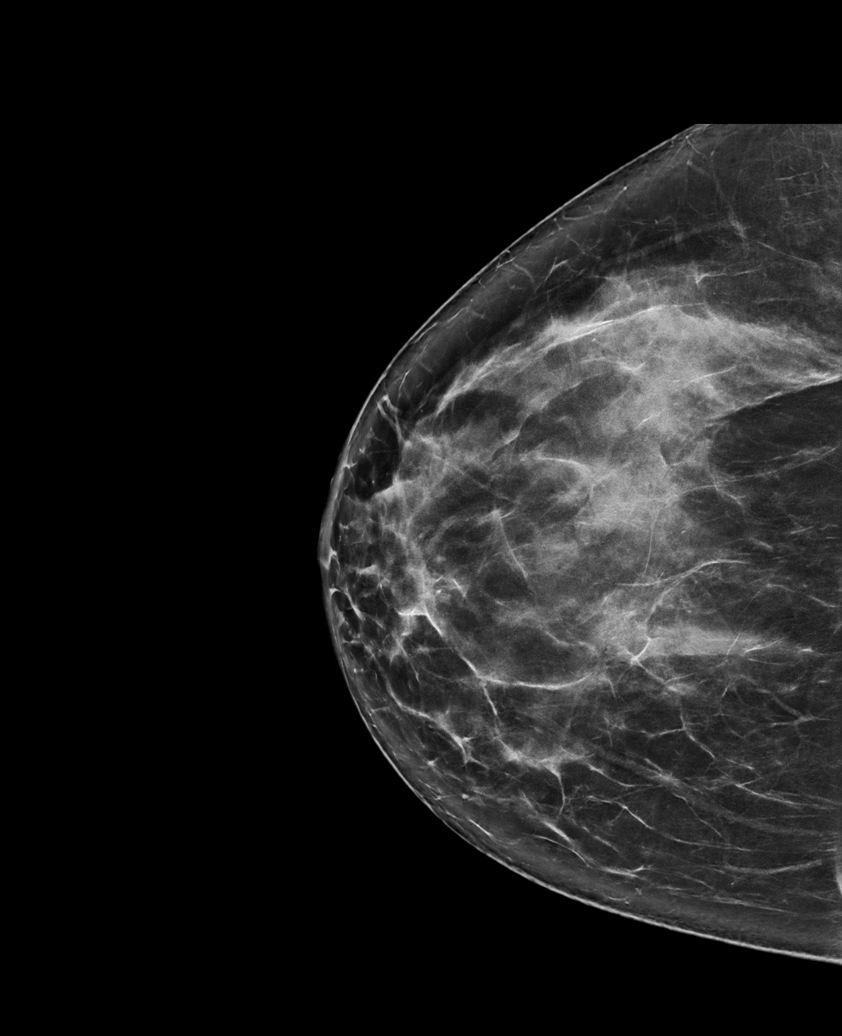

[L MLO synth-2D]
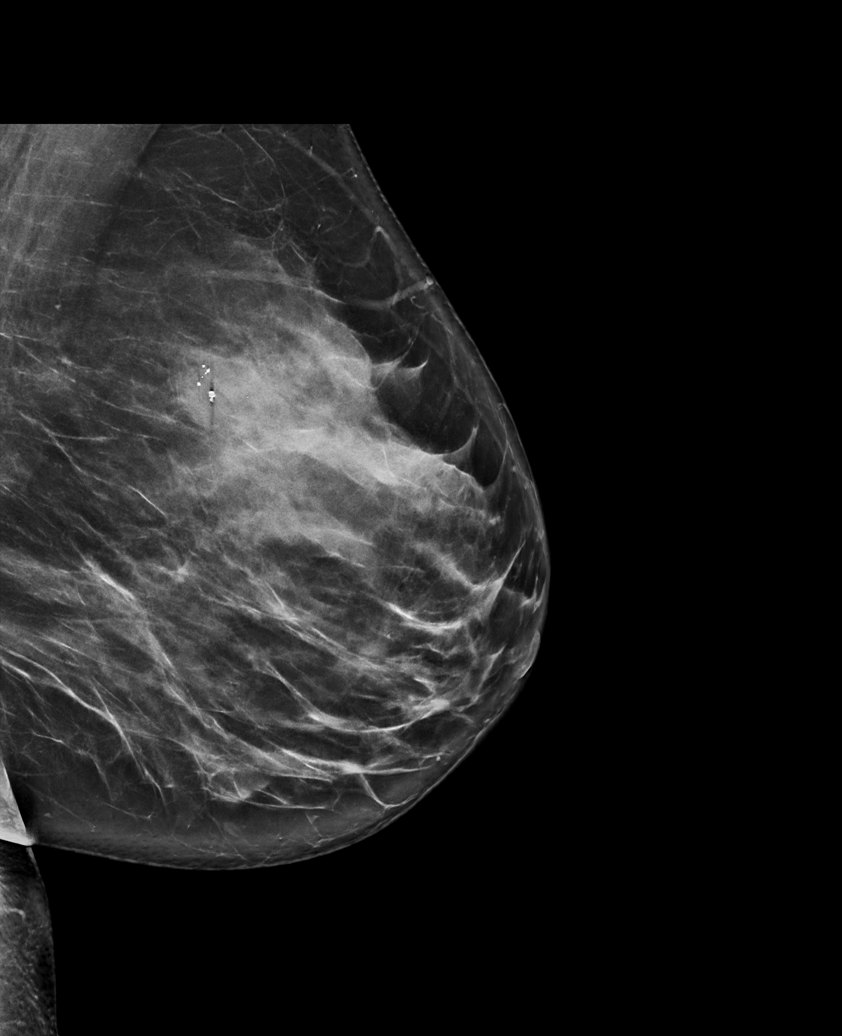

[6 of 36 positions shown; findings below may reference images not displayed]

ACR Breast Density Category c: The breast tissue is heterogeneously
dense, which may obscure small masses.
FINDINGS: Partially obscured circumscribed mass is identified in the UPPER
central RIGHT breast. Partially obscured circumscribed mass in the
UPPER central LEFT breast is associated with coarse calcifications
and stable in appearance. No new or suspicious findings.

Targeted ultrasound is performed, showing a circumscribed oval
hypoechoic parallel mass in the 12 o'clock location of the RIGHT
breast 5 centimeters from the nipple which measures 1.8 x 0.9 x
centimeters. Mass shows long-term stability.

In the 12 o'clock location of the LEFT breast 8 centimeters from the
nipple, a circumscribed oval parallel hypoechoic mass contains
internal calcifications and measures 2.3 x 2.1 x 1.2 centimeters.
Mass shows long-term stability.
IMPRESSION: 1.  No mammographic or ultrasound evidence for malignancy.
2. Stable appearance of probable fibroadenomas in the 12 o'clock
location of the RIGHT breast 12 o'clock location of the LEFT breast.

RECOMMENDATION:
Screening mammogram in one year.(Code:3P-B-3K9)

I have discussed the findings and recommendations with the patient.
If applicable, a reminder letter will be sent to the patient
regarding the next appointment.

BI-RADS CATEGORY  2: Benign.

## 2023-07-13 DIAGNOSIS — Z124 Encounter for screening for malignant neoplasm of cervix: Secondary | ICD-10-CM | POA: Diagnosis not present

## 2023-07-13 DIAGNOSIS — Z01419 Encounter for gynecological examination (general) (routine) without abnormal findings: Secondary | ICD-10-CM | POA: Diagnosis not present

## 2023-07-13 DIAGNOSIS — Z130 Encounter for screening for diseases of the blood and blood-forming organs and certain disorders involving the immune mechanism: Secondary | ICD-10-CM | POA: Diagnosis not present

## 2023-07-13 DIAGNOSIS — Z1231 Encounter for screening mammogram for malignant neoplasm of breast: Secondary | ICD-10-CM | POA: Diagnosis not present

## 2023-07-13 DIAGNOSIS — Z1151 Encounter for screening for human papillomavirus (HPV): Secondary | ICD-10-CM | POA: Diagnosis not present

## 2023-07-31 DIAGNOSIS — L608 Other nail disorders: Secondary | ICD-10-CM | POA: Diagnosis not present

## 2023-10-30 DIAGNOSIS — L603 Nail dystrophy: Secondary | ICD-10-CM | POA: Diagnosis not present

## 2023-10-30 DIAGNOSIS — L608 Other nail disorders: Secondary | ICD-10-CM | POA: Diagnosis not present

## 2023-10-30 DIAGNOSIS — R21 Rash and other nonspecific skin eruption: Secondary | ICD-10-CM | POA: Diagnosis not present

## 2023-12-21 DIAGNOSIS — K219 Gastro-esophageal reflux disease without esophagitis: Secondary | ICD-10-CM | POA: Diagnosis not present

## 2023-12-21 DIAGNOSIS — Z0000 Encounter for general adult medical examination without abnormal findings: Secondary | ICD-10-CM | POA: Diagnosis not present

## 2023-12-21 DIAGNOSIS — F418 Other specified anxiety disorders: Secondary | ICD-10-CM | POA: Diagnosis not present

## 2023-12-21 DIAGNOSIS — G43909 Migraine, unspecified, not intractable, without status migrainosus: Secondary | ICD-10-CM | POA: Diagnosis not present

## 2024-01-09 DIAGNOSIS — M25511 Pain in right shoulder: Secondary | ICD-10-CM | POA: Diagnosis not present

## 2024-01-09 DIAGNOSIS — M542 Cervicalgia: Secondary | ICD-10-CM | POA: Diagnosis not present

## 2024-01-09 DIAGNOSIS — M6281 Muscle weakness (generalized): Secondary | ICD-10-CM | POA: Diagnosis not present

## 2024-01-15 DIAGNOSIS — M542 Cervicalgia: Secondary | ICD-10-CM | POA: Diagnosis not present

## 2024-01-15 DIAGNOSIS — M25511 Pain in right shoulder: Secondary | ICD-10-CM | POA: Diagnosis not present

## 2024-01-15 DIAGNOSIS — M6281 Muscle weakness (generalized): Secondary | ICD-10-CM | POA: Diagnosis not present

## 2024-01-17 DIAGNOSIS — M6281 Muscle weakness (generalized): Secondary | ICD-10-CM | POA: Diagnosis not present

## 2024-01-17 DIAGNOSIS — M25511 Pain in right shoulder: Secondary | ICD-10-CM | POA: Diagnosis not present

## 2024-01-17 DIAGNOSIS — M542 Cervicalgia: Secondary | ICD-10-CM | POA: Diagnosis not present

## 2024-01-22 DIAGNOSIS — M25511 Pain in right shoulder: Secondary | ICD-10-CM | POA: Diagnosis not present

## 2024-01-22 DIAGNOSIS — M6281 Muscle weakness (generalized): Secondary | ICD-10-CM | POA: Diagnosis not present

## 2024-01-22 DIAGNOSIS — M542 Cervicalgia: Secondary | ICD-10-CM | POA: Diagnosis not present

## 2024-01-24 DIAGNOSIS — M542 Cervicalgia: Secondary | ICD-10-CM | POA: Diagnosis not present

## 2024-01-24 DIAGNOSIS — M25511 Pain in right shoulder: Secondary | ICD-10-CM | POA: Diagnosis not present

## 2024-01-24 DIAGNOSIS — M6281 Muscle weakness (generalized): Secondary | ICD-10-CM | POA: Diagnosis not present

## 2024-01-29 DIAGNOSIS — M542 Cervicalgia: Secondary | ICD-10-CM | POA: Diagnosis not present

## 2024-01-29 DIAGNOSIS — M6281 Muscle weakness (generalized): Secondary | ICD-10-CM | POA: Diagnosis not present

## 2024-01-29 DIAGNOSIS — M25511 Pain in right shoulder: Secondary | ICD-10-CM | POA: Diagnosis not present

## 2024-01-31 DIAGNOSIS — M25511 Pain in right shoulder: Secondary | ICD-10-CM | POA: Diagnosis not present

## 2024-01-31 DIAGNOSIS — M542 Cervicalgia: Secondary | ICD-10-CM | POA: Diagnosis not present

## 2024-01-31 DIAGNOSIS — M6281 Muscle weakness (generalized): Secondary | ICD-10-CM | POA: Diagnosis not present

## 2024-02-05 DIAGNOSIS — M25511 Pain in right shoulder: Secondary | ICD-10-CM | POA: Diagnosis not present

## 2024-02-05 DIAGNOSIS — M542 Cervicalgia: Secondary | ICD-10-CM | POA: Diagnosis not present

## 2024-02-05 DIAGNOSIS — M6281 Muscle weakness (generalized): Secondary | ICD-10-CM | POA: Diagnosis not present

## 2024-02-07 DIAGNOSIS — M25511 Pain in right shoulder: Secondary | ICD-10-CM | POA: Diagnosis not present

## 2024-02-07 DIAGNOSIS — M6281 Muscle weakness (generalized): Secondary | ICD-10-CM | POA: Diagnosis not present

## 2024-02-07 DIAGNOSIS — M542 Cervicalgia: Secondary | ICD-10-CM | POA: Diagnosis not present

## 2024-02-12 DIAGNOSIS — M25511 Pain in right shoulder: Secondary | ICD-10-CM | POA: Diagnosis not present

## 2024-02-12 DIAGNOSIS — M6281 Muscle weakness (generalized): Secondary | ICD-10-CM | POA: Diagnosis not present

## 2024-02-12 DIAGNOSIS — M542 Cervicalgia: Secondary | ICD-10-CM | POA: Diagnosis not present

## 2024-02-19 DIAGNOSIS — M25511 Pain in right shoulder: Secondary | ICD-10-CM | POA: Diagnosis not present

## 2024-02-19 DIAGNOSIS — M542 Cervicalgia: Secondary | ICD-10-CM | POA: Diagnosis not present

## 2024-02-19 DIAGNOSIS — M6281 Muscle weakness (generalized): Secondary | ICD-10-CM | POA: Diagnosis not present

## 2024-02-26 DIAGNOSIS — M6281 Muscle weakness (generalized): Secondary | ICD-10-CM | POA: Diagnosis not present

## 2024-02-26 DIAGNOSIS — M25511 Pain in right shoulder: Secondary | ICD-10-CM | POA: Diagnosis not present

## 2024-02-26 DIAGNOSIS — M542 Cervicalgia: Secondary | ICD-10-CM | POA: Diagnosis not present

## 2024-03-04 DIAGNOSIS — M542 Cervicalgia: Secondary | ICD-10-CM | POA: Diagnosis not present

## 2024-03-04 DIAGNOSIS — M6281 Muscle weakness (generalized): Secondary | ICD-10-CM | POA: Diagnosis not present

## 2024-03-04 DIAGNOSIS — M25511 Pain in right shoulder: Secondary | ICD-10-CM | POA: Diagnosis not present

## 2024-03-08 DIAGNOSIS — M25511 Pain in right shoulder: Secondary | ICD-10-CM | POA: Diagnosis not present

## 2024-03-11 DIAGNOSIS — M25511 Pain in right shoulder: Secondary | ICD-10-CM | POA: Diagnosis not present

## 2024-03-11 DIAGNOSIS — M542 Cervicalgia: Secondary | ICD-10-CM | POA: Diagnosis not present

## 2024-03-11 DIAGNOSIS — M6281 Muscle weakness (generalized): Secondary | ICD-10-CM | POA: Diagnosis not present

## 2024-03-18 DIAGNOSIS — M542 Cervicalgia: Secondary | ICD-10-CM | POA: Diagnosis not present

## 2024-03-18 DIAGNOSIS — M25511 Pain in right shoulder: Secondary | ICD-10-CM | POA: Diagnosis not present

## 2024-03-18 DIAGNOSIS — M6281 Muscle weakness (generalized): Secondary | ICD-10-CM | POA: Diagnosis not present

## 2024-03-31 DIAGNOSIS — M25511 Pain in right shoulder: Secondary | ICD-10-CM | POA: Diagnosis not present

## 2024-04-10 DIAGNOSIS — M25511 Pain in right shoulder: Secondary | ICD-10-CM | POA: Diagnosis not present

## 2024-04-10 DIAGNOSIS — M6281 Muscle weakness (generalized): Secondary | ICD-10-CM | POA: Diagnosis not present

## 2024-04-10 DIAGNOSIS — M542 Cervicalgia: Secondary | ICD-10-CM | POA: Diagnosis not present

## 2024-04-19 DIAGNOSIS — M25511 Pain in right shoulder: Secondary | ICD-10-CM | POA: Diagnosis not present

## 2024-04-21 DIAGNOSIS — M6281 Muscle weakness (generalized): Secondary | ICD-10-CM | POA: Diagnosis not present

## 2024-04-21 DIAGNOSIS — M542 Cervicalgia: Secondary | ICD-10-CM | POA: Diagnosis not present

## 2024-04-21 DIAGNOSIS — M25511 Pain in right shoulder: Secondary | ICD-10-CM | POA: Diagnosis not present

## 2024-04-25 DIAGNOSIS — M7501 Adhesive capsulitis of right shoulder: Secondary | ICD-10-CM | POA: Diagnosis not present

## 2024-04-30 DIAGNOSIS — M542 Cervicalgia: Secondary | ICD-10-CM | POA: Diagnosis not present

## 2024-04-30 DIAGNOSIS — M6281 Muscle weakness (generalized): Secondary | ICD-10-CM | POA: Diagnosis not present

## 2024-04-30 DIAGNOSIS — M25511 Pain in right shoulder: Secondary | ICD-10-CM | POA: Diagnosis not present

## 2024-05-05 DIAGNOSIS — M6281 Muscle weakness (generalized): Secondary | ICD-10-CM | POA: Diagnosis not present

## 2024-05-05 DIAGNOSIS — M542 Cervicalgia: Secondary | ICD-10-CM | POA: Diagnosis not present

## 2024-05-05 DIAGNOSIS — M25511 Pain in right shoulder: Secondary | ICD-10-CM | POA: Diagnosis not present

## 2024-05-09 DIAGNOSIS — M7501 Adhesive capsulitis of right shoulder: Secondary | ICD-10-CM | POA: Diagnosis not present

## 2024-05-19 DIAGNOSIS — M6281 Muscle weakness (generalized): Secondary | ICD-10-CM | POA: Diagnosis not present

## 2024-05-19 DIAGNOSIS — M542 Cervicalgia: Secondary | ICD-10-CM | POA: Diagnosis not present

## 2024-05-19 DIAGNOSIS — M25511 Pain in right shoulder: Secondary | ICD-10-CM | POA: Diagnosis not present

## 2024-05-30 DIAGNOSIS — Z1211 Encounter for screening for malignant neoplasm of colon: Secondary | ICD-10-CM | POA: Diagnosis not present

## 2024-05-30 DIAGNOSIS — D122 Benign neoplasm of ascending colon: Secondary | ICD-10-CM | POA: Diagnosis not present

## 2024-06-09 DIAGNOSIS — M542 Cervicalgia: Secondary | ICD-10-CM | POA: Diagnosis not present

## 2024-06-09 DIAGNOSIS — M25511 Pain in right shoulder: Secondary | ICD-10-CM | POA: Diagnosis not present

## 2024-06-09 DIAGNOSIS — M6281 Muscle weakness (generalized): Secondary | ICD-10-CM | POA: Diagnosis not present

## 2024-06-23 DIAGNOSIS — K219 Gastro-esophageal reflux disease without esophagitis: Secondary | ICD-10-CM | POA: Diagnosis not present

## 2024-06-23 DIAGNOSIS — G43909 Migraine, unspecified, not intractable, without status migrainosus: Secondary | ICD-10-CM | POA: Diagnosis not present

## 2024-06-23 DIAGNOSIS — Z23 Encounter for immunization: Secondary | ICD-10-CM | POA: Diagnosis not present

## 2024-06-23 DIAGNOSIS — M25511 Pain in right shoulder: Secondary | ICD-10-CM | POA: Diagnosis not present

## 2024-06-23 DIAGNOSIS — F418 Other specified anxiety disorders: Secondary | ICD-10-CM | POA: Diagnosis not present

## 2024-06-27 DIAGNOSIS — M6281 Muscle weakness (generalized): Secondary | ICD-10-CM | POA: Diagnosis not present

## 2024-06-27 DIAGNOSIS — M25511 Pain in right shoulder: Secondary | ICD-10-CM | POA: Diagnosis not present

## 2024-06-27 DIAGNOSIS — M542 Cervicalgia: Secondary | ICD-10-CM | POA: Diagnosis not present
